# Patient Record
Sex: Female | Born: 1970 | Race: White | Hispanic: No | Marital: Married | Smoking: Never smoker
Health system: Southern US, Community
[De-identification: ages and names within clinical notes are randomized; demographics above are authoritative.]

## PROBLEM LIST (undated history)

## (undated) DIAGNOSIS — T4145XA Adverse effect of unspecified anesthetic, initial encounter: Secondary | ICD-10-CM

## (undated) DIAGNOSIS — Z1589 Genetic susceptibility to other disease: Secondary | ICD-10-CM

## (undated) DIAGNOSIS — Z803 Family history of malignant neoplasm of breast: Secondary | ICD-10-CM

## (undated) DIAGNOSIS — K5900 Constipation, unspecified: Secondary | ICD-10-CM

## (undated) DIAGNOSIS — R5383 Other fatigue: Secondary | ICD-10-CM

## (undated) DIAGNOSIS — T8859XA Other complications of anesthesia, initial encounter: Secondary | ICD-10-CM

## (undated) DIAGNOSIS — Z1502 Genetic susceptibility to malignant neoplasm of ovary: Secondary | ICD-10-CM

## (undated) DIAGNOSIS — Z1509 Genetic susceptibility to other malignant neoplasm: Secondary | ICD-10-CM

## (undated) DIAGNOSIS — C50919 Malignant neoplasm of unspecified site of unspecified female breast: Secondary | ICD-10-CM

## (undated) DIAGNOSIS — G4730 Sleep apnea, unspecified: Secondary | ICD-10-CM

## (undated) HISTORY — DX: Genetic susceptibility to other disease: Z15.89

## (undated) HISTORY — DX: Constipation, unspecified: K59.00

## (undated) HISTORY — DX: Sleep apnea, unspecified: G47.30

## (undated) HISTORY — DX: Other fatigue: R53.83

## (undated) HISTORY — PX: COLONOSCOPY: SHX174

## (undated) HISTORY — DX: Family history of malignant neoplasm of breast: Z80.3

## (undated) HISTORY — DX: Genetic susceptibility to other malignant neoplasm: Z15.09

## (undated) HISTORY — PX: WISDOM TOOTH EXTRACTION: SHX21

## (undated) HISTORY — DX: Malignant neoplasm of unspecified site of unspecified female breast: C50.919

## (undated) HISTORY — DX: Genetic susceptibility to malignant neoplasm of ovary: Z15.02

---

## 1998-01-24 ENCOUNTER — Other Ambulatory Visit: Admission: RE | Admit: 1998-01-24 | Discharge: 1998-01-24 | Payer: Self-pay | Admitting: *Deleted

## 1998-09-04 ENCOUNTER — Other Ambulatory Visit: Admission: RE | Admit: 1998-09-04 | Discharge: 1998-09-04 | Payer: Self-pay | Admitting: *Deleted

## 2005-01-28 ENCOUNTER — Other Ambulatory Visit: Admission: RE | Admit: 2005-01-28 | Discharge: 2005-01-28 | Payer: Self-pay | Admitting: Obstetrics and Gynecology

## 2007-07-16 ENCOUNTER — Ambulatory Visit: Payer: Self-pay | Admitting: Gastroenterology

## 2007-08-05 ENCOUNTER — Ambulatory Visit: Payer: Self-pay | Admitting: Gastroenterology

## 2007-08-05 ENCOUNTER — Encounter: Payer: Self-pay | Admitting: Gastroenterology

## 2007-08-05 DIAGNOSIS — K648 Other hemorrhoids: Secondary | ICD-10-CM | POA: Insufficient documentation

## 2007-08-05 DIAGNOSIS — D126 Benign neoplasm of colon, unspecified: Secondary | ICD-10-CM | POA: Insufficient documentation

## 2008-08-16 ENCOUNTER — Telehealth: Payer: Self-pay | Admitting: Gastroenterology

## 2009-03-04 ENCOUNTER — Emergency Department (HOSPITAL_COMMUNITY): Admission: EM | Admit: 2009-03-04 | Discharge: 2009-03-04 | Payer: Self-pay | Admitting: Family Medicine

## 2009-09-15 ENCOUNTER — Ambulatory Visit: Payer: Self-pay | Admitting: Genetic Counselor

## 2011-03-26 NOTE — Assessment & Plan Note (Signed)
[facility]                         GASTROENTEROLOGY OFFICE NOTE   NAME:[name], [name]                           MRN:          [id number]  DATE:07/16/2007                            DOB:          [birth year]    REFERRING PHYSICIAN:  Juluis Mire, M.D.   REASON FOR REFERRAL:  Chronic constipation and hematochezia.  Family  history of colon cancer.   HISTORY OF PRESENT ILLNESS:  [name] [name] is a 40 year old white female  who returns on referral from Dr. Richardean Chimera.  I saw her previously in  October 1998 for chronic constipation.  She did not follow through with  flexible sigmoidoscopy and further office followup as recommended.  She  states she has had ongoing problems with constipation over the years,  and she has used various laxatives on a regular basis.  She states she  has generally used Correctol, but recently she has been trying other  laxatives including MiraLax on a daily basis which was not effective and  liquid calcium magnesium laxative, which is apparently effective but she  does not like the taste.  She has also tried fiber supplements.  She  notes small amounts of bright red blood per rectum when her constipation  is more severe and she needs to strain.  She has hard stools and  straining with difficulty evacuating stools on a regular basis.  She  occasionally uses enemas.  She notes no change in stool caliber or  weight loss.  She occasionally has mild right lower quadrant pain that  is relieved with bowel movements.  She has had an increase in weight,  worsening fatigue and heavy menstrual periods.  Her maternal grandmother  and maternal grandfather both had colon cancer in their 17s.  Her father  has had multiple precancerous colon polyps.   PAST MEDICAL HISTORY:  Status post vaginal delivery x2.   CURRENT MEDICATIONS:  1. Wellbutrin 150 mg daily.  2. Multivitamin daily.  3. Probiotic daily.  4. Correctol p.r.n.  5. Calcium  magnesium liquid laxative p.r.n.   MEDICATION ALLERGIES:  None known.   SOCIAL HISTORY AND REVIEW OF SYSTEMS:  Per the handwritten form.   PHYSICAL EXAMINATION:  Overweight white female in no acute distress.  Height 5 feet 6 inches, weight 210 pounds, blood pressure is 108/62,  pulse 72 and regular.  HEENT EXAMINATION:  Anicteric sclerae.  Oropharynx clear.  CHEST:  Clear to auscultation bilaterally.  CARDIAC:  Regular rate and rhythm without murmurs appreciated.  ABDOMEN:  Soft and nondistended.  Normoactive bowel sounds.  Mild right  lower quadrant tenderness to deep palpation.  No rebound or guarding.  No palpable organomegaly, masses or hernias.  RECTAL EXAMINATION:  Deferred to time of colonoscopy.  EXTREMITIES:  No clubbing, cyanosis or edema.  NEUROLOGICAL:  Alert and oriented x3.  Grossly nonfocal.   ASSESSMENT AND PLAN:  Chronic constipation which is laxative dependent.  Small volume hematochezia.  Family history of colon cancer.  She is to  maintain a high-fiber diet with adequate fluid intake.  Trial of MiraLax  three times a day  to four times a day and she may titrate the dosing as  needed for adequate regular bowel movements.  Risks, benefits and  alternatives to colonoscopy with possible biopsy and possible  polypectomy discussed with the patient, and she consents to proceed.  This will be scheduled electively.     Venita Lick. Russella Dar, MD, [facility]  Electronically Signed    MTS/MedQ  DD: 07/16/2007  DT: 07/16/2007  Job #: [id number]   cc:   Juluis Mire, M.D.

## 2012-05-04 ENCOUNTER — Other Ambulatory Visit: Payer: Self-pay | Admitting: Obstetrics and Gynecology

## 2012-05-04 DIAGNOSIS — R928 Other abnormal and inconclusive findings on diagnostic imaging of breast: Secondary | ICD-10-CM

## 2012-05-07 ENCOUNTER — Ambulatory Visit
Admission: RE | Admit: 2012-05-07 | Discharge: 2012-05-07 | Disposition: A | Discharge status: Home / Self Care | Payer: BC Managed Care – PPO | Source: Ambulatory Visit | Attending: Obstetrics and Gynecology | Admitting: Obstetrics and Gynecology

## 2012-05-07 DIAGNOSIS — R928 Other abnormal and inconclusive findings on diagnostic imaging of breast: Secondary | ICD-10-CM

## 2012-07-01 ENCOUNTER — Encounter: Payer: Self-pay | Admitting: Gastroenterology

## 2012-08-05 ENCOUNTER — Other Ambulatory Visit: Payer: Self-pay | Admitting: Obstetrics and Gynecology

## 2012-08-05 DIAGNOSIS — N63 Unspecified lump in unspecified breast: Secondary | ICD-10-CM

## 2012-10-27 ENCOUNTER — Encounter: Payer: Self-pay | Admitting: Gastroenterology

## 2012-11-12 ENCOUNTER — Ambulatory Visit
Admission: RE | Admit: 2012-11-12 | Discharge: 2012-11-12 | Disposition: A | Discharge status: Home / Self Care | Payer: BC Managed Care – PPO | Source: Ambulatory Visit | Attending: Obstetrics and Gynecology | Admitting: Obstetrics and Gynecology

## 2012-11-12 DIAGNOSIS — N63 Unspecified lump in unspecified breast: Secondary | ICD-10-CM

## 2012-11-30 ENCOUNTER — Encounter: Payer: Self-pay | Admitting: Gastroenterology

## 2012-11-30 ENCOUNTER — Ambulatory Visit (AMBULATORY_SURGERY_CENTER): Payer: BC Managed Care – PPO | Admitting: *Deleted

## 2012-11-30 VITALS — Ht 66.0 in | Wt 213.0 lb

## 2012-11-30 DIAGNOSIS — Z1211 Encounter for screening for malignant neoplasm of colon: Secondary | ICD-10-CM

## 2012-11-30 DIAGNOSIS — Z800 Family history of malignant neoplasm of digestive organs: Secondary | ICD-10-CM

## 2012-11-30 MED ORDER — PEG-KCL-NACL-NASULF-NA ASC-C 100 G PO SOLR: ORAL | Status: DC

## 2012-11-30 NOTE — Progress Notes (Signed)
No allergy to eggs or soy products 

## 2012-12-03 ENCOUNTER — Telehealth: Payer: Self-pay | Admitting: Gastroenterology

## 2012-12-03 NOTE — Telephone Encounter (Signed)
Pt states she just left her PCP and was diagnosed with a cold and was questioning if okay for procedure Monday with her cough,congestion, and antibiotic use as prescribed today. Pt instructed should be fine for procedure Monday. Take antibiotic as directed, monitor fever any over 100 need to call but with other signs,symptoms should be okay to proceed. Pt verbalized understanding of all instructions given. ewm

## 2012-12-07 ENCOUNTER — Encounter: Payer: Self-pay | Admitting: Gastroenterology

## 2012-12-07 ENCOUNTER — Ambulatory Visit (AMBULATORY_SURGERY_CENTER): Payer: BC Managed Care – PPO | Admitting: Gastroenterology

## 2012-12-07 VITALS — BP 108/73 | HR 83 | Temp 98.8°F | Resp 21 | Ht 66.0 in | Wt 213.0 lb

## 2012-12-07 DIAGNOSIS — Z800 Family history of malignant neoplasm of digestive organs: Secondary | ICD-10-CM

## 2012-12-07 DIAGNOSIS — D126 Benign neoplasm of colon, unspecified: Secondary | ICD-10-CM

## 2012-12-07 DIAGNOSIS — Z1211 Encounter for screening for malignant neoplasm of colon: Secondary | ICD-10-CM

## 2012-12-07 MED ORDER — SODIUM CHLORIDE 0.9 % IV SOLN: 500.0000 mL | INTRAVENOUS | Status: DC

## 2012-12-07 NOTE — Op Note (Signed)
[facility] [address] [place], [address]   COLONOSCOPY PROCEDURE REPORT  PATIENT: [name], [name]  MR#: [id number] BIRTHDATE: November 11, 1971 , 41  yrs. old GENDER: Female ENDOSCOPIST: Meryl Dare, MD, [facility] PROCEDURE DATE:  12/07/2012 PROCEDURE:   Colonoscopy with snare polypectomy ASA CLASS:   Class II INDICATIONS:elevated risk screening and patient's family history of colon cancer, distant relatives. MEDICATIONS: MAC sedation, administered by CRNA and propofol (Diprivan) 400mg  IV DESCRIPTION OF PROCEDURE:   After the risks benefits and alternatives of the procedure were thoroughly explained, informed consent was obtained.  A digital rectal exam revealed no abnormalities of the rectum.   The LB [id number]  endoscope was introduced through the anus and advanced to the cecum, which was identified by both the appendix and ileocecal valve. No adverse events experienced with a tortuous and redundant colon.   The quality of the prep was good, using MoviPrep  The instrument was then slowly withdrawn as the colon was fully examined.  COLON FINDINGS: A sessile polyp measuring 7 mm in size was found at the hepatic flexure.  A polypectomy was performed with a cold snare.  The resection was complete and the polyp tissue was completely retrieved.   A sessile polyp measuring 5 mm in size was found in the sigmoid colon.  A polypectomy was performed with a cold snare.  The resection was complete and the polyp tissue was completely retrieved.   The colon was otherwise normal.  There was no diverticulosis, inflammation, polyps or cancers unless previously stated.  Retroflexed views revealed small internal hemorrhoids. The time to cecum=9 minutes 53 seconds.  Withdrawal time=11 minutes 20 seconds.  The scope was withdrawn and the procedure completed.  COMPLICATIONS: There were no complications.  ENDOSCOPIC IMPRESSION: 1.   Sessile polyp measuring 7 mm at the hepatic  flexure; polypectomy performed with a cold snare 2.   Sessile polyp measuring 5 mm in the sigmoid colon; polypectomy performed with a cold snare 3.   Small internal hemorrhoids  RECOMMENDATIONS: 1.  Await pathology results 2.  Repeat Colonoscopy in 5 years.   eSigned:  Meryl Dare, MD, [facility] 12/07/2012 8:57 AM   cc: Richardean Chimera, MD

## 2012-12-07 NOTE — Progress Notes (Signed)
Called to room to assist during endoscopic procedure.  Patient ID and intended procedure confirmed with present staff. Received instructions for my participation in the procedure from the performing physician.  

## 2012-12-07 NOTE — Progress Notes (Signed)
Patient did not experience any of the following events: a burn prior to discharge; a fall within the facility; wrong site/side/patient/procedure/implant event; or a hospital transfer or hospital admission upon discharge from the facility. (G8907) Patient did not have preoperative order for IV antibiotic SSI prophylaxis. (G8918)  

## 2012-12-07 NOTE — Patient Instructions (Addendum)

## 2012-12-07 NOTE — Progress Notes (Signed)
VSS< A&OX3 Pleased with MAC, Report to April RN

## 2012-12-08 ENCOUNTER — Telehealth: Payer: Self-pay | Admitting: *Deleted

## 2012-12-08 NOTE — Telephone Encounter (Signed)
  Follow up Call-  Call back number 12/07/2012  Post procedure Call Back phone  # [phone]  Permission to leave phone message Yes     Patient questions:  Do you have a fever, pain , or abdominal swelling? no Pain Score  0 *  Have you tolerated food without any problems? yes  Have you been able to return to your normal activities? yes  Do you have any questions about your discharge instructions: Diet   no Medications  no Follow up visit  no  Do you have questions or concerns about your Care? no  Actions: * If pain score is 4 or above: No action needed, pain <4.

## 2012-12-10 ENCOUNTER — Encounter: Payer: Self-pay | Admitting: Gastroenterology

## 2013-04-07 ENCOUNTER — Other Ambulatory Visit: Payer: Self-pay | Admitting: Obstetrics and Gynecology

## 2013-04-07 DIAGNOSIS — N6489 Other specified disorders of breast: Secondary | ICD-10-CM

## 2013-05-12 ENCOUNTER — Ambulatory Visit
Admission: RE | Admit: 2013-05-12 | Discharge: 2013-05-12 | Disposition: A | Discharge status: Home / Self Care | Payer: BC Managed Care – PPO | Source: Ambulatory Visit | Attending: Obstetrics and Gynecology | Admitting: Obstetrics and Gynecology

## 2013-05-12 DIAGNOSIS — N6489 Other specified disorders of breast: Secondary | ICD-10-CM

## 2013-11-11 HISTORY — PX: OTHER SURGICAL HISTORY: SHX169

## 2013-12-03 ENCOUNTER — Telehealth: Payer: Self-pay | Admitting: Gastroenterology

## 2013-12-03 NOTE — Telephone Encounter (Signed)
OK. She can use Prep H supp daily for 5 days for presumed hemorrhoids.

## 2013-12-03 NOTE — Telephone Encounter (Signed)
Patient with a history of small internal hemorrhoids on colonoscopy 11/2012.  She has had 4 days of passing bright red blood with stools.  She is not passing blood independent of stool.  She does feels "a little gassy", but otherwise no complaints.  She will come in and be seen on 12/07/13 with Alonza Bogus, PA.  She will go to the ER until the appt if she develops any additional symptoms or has blood independent of stool.

## 2013-12-03 NOTE — Telephone Encounter (Signed)
Patient aware.

## 2013-12-07 ENCOUNTER — Ambulatory Visit: Payer: BC Managed Care – PPO | Admitting: Gastroenterology

## 2014-06-20 ENCOUNTER — Other Ambulatory Visit: Payer: Self-pay | Admitting: Obstetrics and Gynecology

## 2014-06-21 LAB — CYTOLOGY - PAP

## 2014-12-21 ENCOUNTER — Other Ambulatory Visit: Payer: Self-pay | Admitting: Obstetrics and Gynecology

## 2014-12-22 LAB — CYTOLOGY - PAP

## 2015-08-02 ENCOUNTER — Other Ambulatory Visit: Payer: Self-pay | Admitting: Obstetrics and Gynecology

## 2015-08-03 LAB — CYTOLOGY - PAP

## 2015-11-12 HISTORY — PX: REPLACEMENT TOTAL KNEE: SUR1224

## 2017-09-02 ENCOUNTER — Telehealth: Payer: Self-pay | Admitting: Gastroenterology

## 2017-09-02 NOTE — Telephone Encounter (Signed)
Patient c/o constipation and hemorrhoids.  She is asking about a referral for surgery.  Patient has not been seen since 2014.  She is advised she will need to be seen prior to any referrals.  She will come in on 10/21/17 9:15 with Dr. Fuller Plan and will try Miralax 1-2 times a day until appt.

## 2017-10-21 ENCOUNTER — Ambulatory Visit: Payer: BC Managed Care – PPO | Admitting: Gastroenterology

## 2017-11-12 ENCOUNTER — Other Ambulatory Visit: Payer: Self-pay | Admitting: Obstetrics and Gynecology

## 2017-11-12 DIAGNOSIS — Z803 Family history of malignant neoplasm of breast: Secondary | ICD-10-CM

## 2017-11-21 ENCOUNTER — Telehealth: Payer: Self-pay

## 2017-11-21 NOTE — Telephone Encounter (Signed)
Patient notified Procedure and pre-visit scheduled

## 2017-11-21 NOTE — Telephone Encounter (Signed)
Left message for patient to call back  

## 2017-11-21 NOTE — Telephone Encounter (Signed)
-----  Message from Ladene Artist, MD sent at 11/21/2017 12:31 PM EST ----- She is due for colonoscopy 11/2017 with history of colon polyps. Received records from Dr. 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 Recommend that she schedules a direct colonoscopy as soon as she can.

## 2017-11-29 ENCOUNTER — Ambulatory Visit
Admission: RE | Admit: 2017-11-29 | Discharge: 2017-11-29 | Disposition: A | Discharge status: Home / Self Care | Payer: BC Managed Care – PPO | Source: Ambulatory Visit | Attending: Obstetrics and Gynecology | Admitting: Obstetrics and Gynecology

## 2017-11-29 DIAGNOSIS — Z803 Family history of malignant neoplasm of breast: Secondary | ICD-10-CM

## 2017-11-29 MED ORDER — GADOBENATE DIMEGLUMINE 529 MG/ML IV SOLN
19.0000 mL | Freq: Once | INTRAVENOUS | Status: AC | PRN
  Administered 2017-11-29: 19 mL via INTRAVENOUS

## 2017-12-04 ENCOUNTER — Telehealth: Payer: Self-pay | Admitting: Hematology and Oncology

## 2017-12-04 ENCOUNTER — Encounter: Payer: Self-pay | Admitting: Hematology and Oncology

## 2017-12-04 NOTE — Telephone Encounter (Signed)
Appt has been scheduled for the pt to see Dr. Lindi Adie on 2/13 at 915am. 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 Letter mailed with the appt information

## 2017-12-08 ENCOUNTER — Ambulatory Visit: Payer: BC Managed Care – PPO | Admitting: Gastroenterology

## 2017-12-08 DIAGNOSIS — Z1501 Genetic susceptibility to malignant neoplasm of breast: Secondary | ICD-10-CM | POA: Insufficient documentation

## 2017-12-09 ENCOUNTER — Ambulatory Visit (AMBULATORY_SURGERY_CENTER): Payer: Self-pay

## 2017-12-09 VITALS — Ht 66.0 in | Wt 200.2 lb

## 2017-12-09 DIAGNOSIS — Z800 Family history of malignant neoplasm of digestive organs: Secondary | ICD-10-CM

## 2017-12-09 DIAGNOSIS — Z86010 Personal history of colonic polyps: Secondary | ICD-10-CM

## 2017-12-09 MED ORDER — NA SULFATE-K SULFATE-MG SULF 17.5-3.13-1.6 GM/177ML PO SOLN: 1.0000 | Freq: Once | ORAL | 0 refills | Status: AC

## 2017-12-09 NOTE — Progress Notes (Signed)
Per pt, no allergies to soy or egg products.Pt not taking any weight loss meds or using  O2 at home.  Pt refused emmi video. 

## 2017-12-09 NOTE — Addendum Note (Signed)
Addended by: Selina Cooley on: 12/09/2017 03:44 PM   Modules accepted: Orders

## 2017-12-10 ENCOUNTER — Encounter: Payer: Self-pay | Admitting: Gastroenterology

## 2017-12-16 ENCOUNTER — Ambulatory Visit: Payer: Self-pay | Admitting: General Surgery

## 2017-12-24 ENCOUNTER — Inpatient Hospital Stay: Payer: BC Managed Care – PPO | Attending: Hematology and Oncology | Admitting: Hematology and Oncology

## 2017-12-24 DIAGNOSIS — Z1501 Genetic susceptibility to malignant neoplasm of breast: Secondary | ICD-10-CM | POA: Diagnosis present

## 2017-12-24 DIAGNOSIS — Z1502 Genetic susceptibility to malignant neoplasm of ovary: Secondary | ICD-10-CM

## 2017-12-24 DIAGNOSIS — Z1589 Genetic susceptibility to other disease: Secondary | ICD-10-CM

## 2017-12-24 DIAGNOSIS — Z800 Family history of malignant neoplasm of digestive organs: Secondary | ICD-10-CM | POA: Diagnosis not present

## 2017-12-24 DIAGNOSIS — Z1509 Genetic susceptibility to other malignant neoplasm: Secondary | ICD-10-CM

## 2017-12-24 DIAGNOSIS — Z803 Family history of malignant neoplasm of breast: Secondary | ICD-10-CM | POA: Diagnosis not present

## 2017-12-24 NOTE — Progress Notes (Signed)
[facility] NOTE  Patient Care Team: Arvella Nigh, MD as PCP - General (Obstetrics and Gynecology)  CHIEF COMPLAINTS/PURPOSE OF CONSULTATION:  CHEK-2 mutation  HISTORY OF PRESENTING ILLNESS:  [name] [name] 47 y.o. female is here because of recent diagnosis of CHEK-2 mutation.  Patient had an extensive family history of cancers including her mother who had breast cancer who died at the age of 86.  She has 5 aunts who had breast cancers as well.  Grandmother had colon cancer.  She was referred for genetic testing by her physicians.  The genetic test result came back positive for CHEK-2 [phone] del.  She was referred to see Dr. Marlou Starks who discussed different options with her and determined that bilateral mastectomies is her preference. She also gets colonoscopies every 3-5 years.  Tomorrow she has a colonoscopy scheduled.  We were asked to consult to discuss the risks and benefits of different types of screening measures.   MEDICAL HISTORY:  Past Medical History:  Diagnosis Date  . 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 Constipation    uses suppositories prn  . Fatigue    Juice plus and testosterone helps    SURGICAL HISTORY: Past Surgical History:  Procedure Laterality Date  . COLONOSCOPY    . 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 torn meniscus  2015   left leg/ right knee in 2016  . WISDOM TOOTH EXTRACTION     in mid 20's    SOCIAL HISTORY: Social History   Socioeconomic History  . Marital status: Married    Spouse name: Not on file  . Number of children: Not on file  . Years of education: Not on file  . Highest education level: Not on file  Social Needs  . Financial resource strain: Not on file  . Food insecurity - worry: Not on file  . Food insecurity - inability: Not on file  . Transportation needs - medical: Not on file  . Transportation needs - non-medical: Not on file   Occupational History  . Not on file  Tobacco Use  . Smoking status: Never Smoker  . Smokeless tobacco: Never Used  Substance and Sexual Activity  . Alcohol use: No  . Drug use: No  . Sexual activity: Yes    Comment: husband had vasectomy  Other Topics Concern  . Not on file  Social History Narrative  . Not on file    FAMILY HISTORY: Family History  Problem Relation Age of Onset  . Colon cancer Maternal Grandmother   . Breast cancer Mother   . Heart disease Father   . Stroke Father   . Esophageal cancer Neg Hx   . Stomach cancer Neg Hx   . Rectal cancer Neg Hx     ALLERGIES:  is allergic to oxycodone.  MEDICATIONS:  Current Outpatient Medications  Medication Sig Dispense Refill  . Acetaminophen (TYLENOL PO) Take 500 mg by mouth as needed.    Marland Kitchen amoxicillin-clavulanate (AUGMENTIN) 875-125 MG per tablet     . aspirin EC 81 MG tablet Take 81 mg by mouth daily.    . Cholecalciferol (VITAMIN D PO) Take 3,000 Units by mouth daily.     . COD LIVER OIL PO Take by mouth daily.    . NON FORMULARY Testosterone cream 2 %- Use daily at night    . NON FORMULARY Juice Plus capsules-Take one daily  No current facility-administered medications for this visit.     REVIEW OF SYSTEMS:   Constitutional: Denies fevers, chills or abnormal night sweats Eyes: Denies blurriness of vision, double vision or watery eyes Ears, nose, mouth, throat, and face: Denies mucositis or sore throat Respiratory: Denies cough, dyspnea or wheezes Cardiovascular: Denies palpitation, chest discomfort or lower extremity swelling Gastrointestinal:  Denies nausea, heartburn or change in bowel habits Skin: Denies abnormal skin rashes Lymphatics: Denies new lymphadenopathy or easy bruising Neurological:Denies numbness, tingling or new weaknesses Behavioral/Psych: Mood is stable, no new changes  Breast:  Denies any palpable lumps or discharge All other systems were reviewed with the patient and are  negative.  PHYSICAL EXAMINATION: ECOG PERFORMANCE STATUS: 0 - Asymptomatic  Vitals:   12/24/17 0915  BP: 115/63  Pulse: 93  Resp: 20  Temp: 98.2 F (36.8 C)  SpO2: 100%   Filed Weights   12/24/17 0915  Weight: 198 lb 6.4 oz (90 kg)    GENERAL:alert, no distress and comfortable SKIN: skin color, texture, turgor are normal, no rashes or significant lesions EYES: normal, conjunctiva are pink and non-injected, sclera clear OROPHARYNX:no exudate, no erythema and lips, buccal mucosa, and tongue normal  NECK: supple, thyroid normal size, non-tender, without nodularity LYMPH:  no palpable lymphadenopathy in the cervical, axillary or inguinal LUNGS: clear to auscultation and percussion with normal breathing effort HEART: regular rate & rhythm and no murmurs and no lower extremity edema ABDOMEN:abdomen soft, non-tender and normal bowel sounds Musculoskeletal:no cyanosis of digits and no clubbing  PSYCH: alert & oriented x 3 with fluent speech NEURO: no focal motor/sensory deficits BREAST: No palpable nodules in breast. No palpable axillary or supraclavicular lymphadenopathy (exam performed in the presence of a chaperone)   LABORATORY DATA:  I have reviewed the data as listed No results found for: WBC, HGB, HCT, MCV, PLT No results found for: NA, K, CL, CO2  RADIOGRAPHIC STUDIES: I have personally reviewed the radiological reports and agreed with the findings in the report.  ASSESSMENT AND PLAN:  Monoallelic mutation of CHEK2 gene in female patient CHEK 2 :Based on NCCN guidelines, this is a pathogenic mutation that has been associated with risk of not only breast cancer but also colon, thyroid and kidney cancers.   Pathogenesis: I discussed with the patient that CHEK-2 encodes for a serine-threonine tyrosine kinase involved in the DNA repair in combination with ATM, BRCA1, P53 genes. Patients with mutation of the CHEK-2 gene results in the damaged DNAgetting replicated and increase  the patient's risk for cancers.   Breast cancer surveillance: I discussed annual mammograms and annual breast MRIs combined with monthly self breast examinations and breast examinations with her physician VS bilateral mastectomies.   Given her familial history, if she chooses to undergo bilateral mastectomy it would not be unreasonable. Lifetime risk of breast cancer varies from 28% to 38%. Higher with family history of breast cancer.   Other cancer surveillance: Apart of breast cancer, there are no definite surveillance approaches to kidney cancer. For colon cancer she is currently getting colonoscopies every 3-5 years.  Since there is no family history thyroid cancer she will need manual thyroid examinations for any lumps or nodules.  Return to clinic on an as-needed basis   All questions were answered. The patient knows to call the clinic with any problems, questions or concerns.    Harriette Ohara, MD 12/24/17

## 2017-12-24 NOTE — Assessment & Plan Note (Signed)
CHEK 2 :Based on NCCN guidelines, this is a pathogenic mutation that has been associated with risk of not only breast cancer but also colon, thyroid and kidney cancers.   Pathogenesis: I discussed with the patient that CHEK-2 encodes for a serine-threonine tyrosine kinase involved in the DNA repair in combination with ATM, BRCA1, P53 genes. Patients with mutation of the CHEK-2 gene results in the damaged DNAgetting replicated and increase the patient's risk for cancers.   Surveillance: I recommended annual mammograms and annual breast MRIs combined with monthly self breast examinations and breast examinations with her physician. Patient would like to continue the surveillance plan under the care of her gynecologist. I also discussed different other options including prophylactic bilateral mastectomies. Based on NCCN guidelines, there is no data to support the role of prophylactic bilateral mastectomy for CHEK-2 mutation. However given her familial history, if she chooses to undergo bilateral mastectomy it would not be unreasonable. Lifetime risk of breast cancer varies from 28% to 38%. Higher with family history of breast cancer.   Other cancer surveillance: Apart of breast cancer, there are no definite surveillance approaches to kidney cancer. For colon cancer she will need a colonoscopy at age 72 since there is no family history and for thyroid cancer she will need manual thyroid examinations for any lumps or nodules.  Return to clinic on an annual basis

## 2017-12-25 ENCOUNTER — Ambulatory Visit (AMBULATORY_SURGERY_CENTER): Payer: BC Managed Care – PPO | Admitting: Gastroenterology

## 2017-12-25 ENCOUNTER — Encounter: Payer: BC Managed Care – PPO | Admitting: Gastroenterology

## 2017-12-25 ENCOUNTER — Encounter: Payer: Self-pay | Admitting: Gastroenterology

## 2017-12-25 ENCOUNTER — Other Ambulatory Visit: Payer: Self-pay

## 2017-12-25 VITALS — BP 109/71 | HR 75 | Temp 98.0°F | Resp 13 | Ht 66.0 in | Wt 200.0 lb

## 2017-12-25 DIAGNOSIS — Z1211 Encounter for screening for malignant neoplasm of colon: Secondary | ICD-10-CM | POA: Diagnosis not present

## 2017-12-25 DIAGNOSIS — D123 Benign neoplasm of transverse colon: Secondary | ICD-10-CM

## 2017-12-25 DIAGNOSIS — Z1212 Encounter for screening for malignant neoplasm of rectum: Secondary | ICD-10-CM

## 2017-12-25 DIAGNOSIS — Z800 Family history of malignant neoplasm of digestive organs: Secondary | ICD-10-CM

## 2017-12-25 DIAGNOSIS — D125 Benign neoplasm of sigmoid colon: Secondary | ICD-10-CM | POA: Diagnosis not present

## 2017-12-25 MED ORDER — SODIUM CHLORIDE 0.9 % IV SOLN: 500.0000 mL | Freq: Once | INTRAVENOUS | Status: DC

## 2017-12-25 NOTE — Progress Notes (Signed)
Called to room to assist during endoscopic procedure.  Patient ID and intended procedure confirmed with present staff. Received instructions for my participation in the procedure from the performing physician.  

## 2017-12-25 NOTE — Progress Notes (Signed)
Pt's states no medical or surgical changes since previsit or office visit. 

## 2017-12-25 NOTE — Op Note (Signed)
[place] Patient Name: [name] [name] Procedure Date: 12/25/2017 8:34 AM MRN: [id number] Endoscopist: Ladene Artist , MD Age: 47 Referring MD:  Date of Birth: [birth year] Gender: Female Account #: [id number] Procedure:                Colonoscopy Indications:              Colon cancer screening in patient at increased                            risk: Family history of colorectal cancer in                            multiple 2nd degree relatives. CHEK2 gene positive. Medicines:                Monitored Anesthesia Care Procedure:                Pre-Anesthesia Assessment:                           - Prior to the procedure, a History and Physical                            was performed, and patient medications and                            allergies were reviewed. The patient's tolerance of                            previous anesthesia was also reviewed. The risks                            and benefits of the procedure and the sedation                            options and risks were discussed with the patient.                            All questions were answered, and informed consent                            was obtained. Prior Anticoagulants: The patient has                            taken no previous anticoagulant or antiplatelet                            agents. ASA Grade Assessment: II - A patient with                            mild systemic disease. After reviewing the risks                            and benefits, the patient was deemed in  satisfactory condition to undergo the procedure.                           After obtaining informed consent, the colonoscope                            was passed under direct vision. Throughout the                            procedure, the patient's blood pressure, pulse, and                            oxygen saturations were monitored continuously. The                            Model [id number]-[id number]  [id number]) scope was introduced                            through the anus and advanced to the the cecum,                            identified by appendiceal orifice and ileocecal                            valve. The ileocecal valve, appendiceal orifice,                            and rectum were photographed. The quality of the                            bowel preparation was excellent. The colonoscopy                            was performed without difficulty. The colon was                            tortuous. The patient tolerated the procedure well. Scope In: 8:47:12 AM Scope Out: 9:10:09 AM Scope Withdrawal Time: 0 hours 14 minutes 52 seconds  Total Procedure Duration: 0 hours 22 minutes 57 seconds  Findings:                 The perianal and digital rectal examinations were                            normal.                           Two sessile polyps were found in the sigmoid colon                            and hepatic flexure. The polyps were 6 to 8 mm in                            size. These polyps were removed with a cold snare.  Resection and retrieval were complete.                           Internal hemorrhoids were found during                            retroflexion. The hemorrhoids were medium-sized and                            Grade II (internal hemorrhoids that prolapse but                            reduce spontaneously).                           The exam was otherwise without abnormality on                            direct and retroflexion views. Complications:            No immediate complications. Estimated blood loss:                            None. Estimated Blood Loss:     Estimated blood loss: none. Impression:               - Two 6 to 8 mm polyps in the sigmoid colon and at                            the hepatic flexure, removed with a cold snare.                            Resected and retrieved.                            - Internal hemorrhoids.                           - The examination was otherwise normal on direct                            and retroflexion views. Recommendation:           - Repeat colonoscopy in 5 years for surveillance.                           - Patient has a contact number available for                            emergencies. The signs and symptoms of potential                            delayed complications were discussed with the                            patient. Return to normal activities tomorrow.  Written discharge instructions were provided to the                            patient.                           - Resume previous diet.                           - Continue present medications.                           - Await pathology results. Ladene Artist, MD 12/25/2017 9:13:01 AM This report has been signed electronically.

## 2017-12-25 NOTE — Progress Notes (Signed)
Report to PACU, RN, vss, BBS= Clear.  

## 2017-12-25 NOTE — Patient Instructions (Signed)
YOU HAD AN ENDOSCOPIC PROCEDURE TODAY AT [facility]:   Refer to the procedure report that was given to you for any specific questions about what was found during the examination.  If the procedure report does not answer your questions, please call your gastroenterologist to clarify.  If you requested that your care partner not be given the details of your procedure findings, then the procedure report has been included in a sealed envelope for you to review at your convenience later.  YOU SHOULD EXPECT: Some feelings of bloating in the abdomen. Passage of more gas than usual.  Walking can help get rid of the air that was put into your GI tract during the procedure and reduce the bloating. If you had a lower endoscopy (such as a colonoscopy or flexible sigmoidoscopy) you may notice spotting of blood in your stool or on the toilet paper. If you underwent a bowel prep for your procedure, you may not have a normal bowel movement for a few days.  Please Note:  You might notice some irritation and congestion in your nose or some drainage.  This is from the oxygen used during your procedure.  There is no need for concern and it should clear up in a day or so.  SYMPTOMS TO REPORT IMMEDIATELY:   Following lower endoscopy (colonoscopy or flexible sigmoidoscopy):  Excessive amounts of blood in the stool  Significant tenderness or worsening of abdominal pains  Swelling of the abdomen that is new, acute  Fever of 100F or higher  For urgent or emergent issues, a gastroenterologist can be reached at any hour by calling [phone].   DIET:  We do recommend a small meal at first, but then you may proceed to your regular diet.  Drink plenty of fluids but you should avoid alcoholic beverages for 24 hours.  MEDICATIONS: Continue present medications.  Please see handouts given to you by your recovery nurse.  FOLLOW-UP: Call the GI office to schedule hemorrhoid banding procedure if you wish  to have that performed.  ACTIVITY:  You should plan to take it easy for the rest of today and you should NOT DRIVE or use heavy machinery until tomorrow (because of the sedation medicines used during the test).    FOLLOW UP: Our staff will call the number listed on your records the next business day following your procedure to check on you and address any questions or concerns that you may have regarding the information given to you following your procedure. If we do not reach you, we will leave a message.  However, if you are feeling well and you are not experiencing any problems, there is no need to return our call.  We will assume that you have returned to your regular daily activities without incident.  If any biopsies were taken you will be contacted by phone or by letter within the next 1-3 weeks.  Please call us at [phone] if you have not heard about the biopsies in 3 weeks.    Thank you for allowing [place] to provide for your healthcare needs today.  SIGNATURES/CONFIDENTIALITY: You and/or your care partner have signed paperwork which will be entered into your electronic medical record.  These signatures attest to the fact that that the information above on your After Visit Summary has been reviewed and is understood.  Full responsibility of the confidentiality of this discharge information lies with you and/or your care-partner.

## 2017-12-26 ENCOUNTER — Telehealth: Payer: Self-pay | Admitting: *Deleted

## 2017-12-26 NOTE — Telephone Encounter (Signed)
  Follow up Call-  Call back number 12/25/2017  Post procedure Call Back phone  # [phone]  Permission to leave phone message Yes  Some recent data might be hidden     Patient questions:  Do you have a fever, pain , or abdominal swelling? No. Pain Score  0 *  Have you tolerated food without any problems? Yes.    Have you been able to return to your normal activities? No.  Do you have any questions about your discharge instructions: Diet   No. Medications  No. Follow up visit  No.  Do you have questions or concerns about your Care? No.  Actions: * If pain score is 4 or above: No action needed, pain <4.

## 2017-12-26 NOTE — Telephone Encounter (Signed)
No answer, left message to call if questions or concerns. 

## 2018-01-10 ENCOUNTER — Encounter: Payer: Self-pay | Admitting: Gastroenterology

## 2018-01-21 ENCOUNTER — Ambulatory Visit (HOSPITAL_COMMUNITY): Payer: Self-pay | Admitting: Plastic Surgery

## 2018-01-27 NOTE — Pre-Procedure Instructions (Signed)
[name] [name]  01/27/2018      [facility] [place], [place] [address] Phone: [phone] Fax: [phone]    Your procedure is scheduled on Thurs., February 05, 2018  Report to [facility] Admitting Entrance "A" at 5:30AM   Call this number if you have problems the morning of surgery:  [phone]   Remember:  Do not eat food or drink liquids after midnight.  Take these medicines the morning of surgery with A SIP OF WATER: If needed Acetaminophen (TYLENOL) for pain.  Follow your doctor's instruction regarding Aspirin.  7 days before surgery (Mar. 21), stop taking all Aspirins, Vitamins, Fish oils, and Herbal medications. Also stop all NSAIDS i.e. Advil, Ibuprofen, Motrin, Aleve, Anaprox, Naproxen, BC and Goody Powders.  Please complete your PRE-SURGERY ENSURE that was given to before you leave your house the morning of surgery.  Please, if able, drink it in one setting. DO NOT SIP.   Do not wear jewelry, make-up or nail polish.  Do not wear lotions, powders, perfumes, or deodorant.  Do not shave 48 hours prior to surgery.    Do not bring valuables to the hospital.  [facility] is not responsible for any belongings or valuables.  Contacts, dentures or bridgework may not be worn into surgery.  Leave your suitcase in the car.  After surgery it may be brought to your room.  For patients admitted to the hospital, discharge time will be determined by your treatment team.  Patients discharged the day of surgery will not be allowed to drive home.   Special instructions:  [place]- Preparing For Surgery  Before surgery, you can play an important role. Because skin is not sterile, your skin needs to be as free of germs as possible. You can reduce the number of germs on your skin by washing with CHG (chlorahexidine gluconate) Soap before surgery.  CHG is an antiseptic cleaner which kills germs and bonds with  the skin to continue killing germs even after washing.  Please do not use if you have an allergy to CHG or antibacterial soaps. If your skin becomes reddened/irritated stop using the CHG.  Do not shave (including legs and underarms) for at least 48 hours prior to first CHG shower. It is OK to shave your face.  Please follow these instructions carefully.   1. Shower the NIGHT BEFORE SURGERY and the MORNING OF SURGERY with CHG.   2. If you chose to wash your hair, wash your hair first as usual with your normal shampoo.  3. After you shampoo, rinse your hair and body thoroughly to remove the shampoo.  4. Use CHG as you would any other liquid soap. You can apply CHG directly to the skin and wash gently with a scrungie or a clean washcloth.   5. Apply the CHG Soap to your body ONLY FROM THE NECK DOWN.  Do not use on open wounds or open sores. Avoid contact with your eyes, ears, mouth and genitals (private parts). Wash Face and genitals (private parts)  with your normal soap.  6. Wash thoroughly, paying special attention to the area where your surgery will be performed.  7. Thoroughly rinse your body with warm water from the neck down.  8. DO NOT shower/wash with your normal soap after using and rinsing off the CHG Soap.  9. Pat yourself dry with a CLEAN TOWEL.  10. Wear CLEAN PAJAMAS to  bed the night before surgery, wear comfortable clothes the morning of surgery  11. Place CLEAN SHEETS on your bed the night of your first shower and DO NOT SLEEP WITH PETS.  Day of Surgery: Do not apply any deodorants/lotions. Please wear clean clothes to the hospital/surgery center.    Please read over the following fact sheets that you were given. Pain Booklet, Coughing and Deep Breathing and Surgical Site Infection Prevention

## 2018-01-28 ENCOUNTER — Encounter (HOSPITAL_COMMUNITY): Payer: Self-pay

## 2018-01-28 ENCOUNTER — Other Ambulatory Visit: Payer: Self-pay

## 2018-01-28 ENCOUNTER — Encounter (HOSPITAL_COMMUNITY)
Admission: RE | Admit: 2018-01-28 | Discharge: 2018-01-28 | Disposition: A | Discharge status: Home / Self Care | Payer: BC Managed Care – PPO | Source: Ambulatory Visit | Attending: General Surgery | Admitting: General Surgery

## 2018-01-28 DIAGNOSIS — E669 Obesity, unspecified: Secondary | ICD-10-CM | POA: Diagnosis not present

## 2018-01-28 DIAGNOSIS — Z7982 Long term (current) use of aspirin: Secondary | ICD-10-CM | POA: Diagnosis not present

## 2018-01-28 DIAGNOSIS — Z01818 Encounter for other preprocedural examination: Secondary | ICD-10-CM | POA: Insufficient documentation

## 2018-01-28 DIAGNOSIS — I4510 Unspecified right bundle-branch block: Secondary | ICD-10-CM | POA: Insufficient documentation

## 2018-01-28 DIAGNOSIS — Z96651 Presence of right artificial knee joint: Secondary | ICD-10-CM | POA: Diagnosis not present

## 2018-01-28 DIAGNOSIS — Z6832 Body mass index (BMI) 32.0-32.9, adult: Secondary | ICD-10-CM | POA: Insufficient documentation

## 2018-01-28 HISTORY — DX: Other complications of anesthesia, initial encounter: T88.59XA

## 2018-01-28 HISTORY — DX: Adverse effect of unspecified anesthetic, initial encounter: T41.45XA

## 2018-01-28 LAB — CBC
HEMATOCRIT: 39.3 % (ref 36.0–46.0)
HEMOGLOBIN: 12.8 g/dL (ref 12.0–15.0)
MCH: 28.1 pg (ref 26.0–34.0)
MCHC: 32.6 g/dL (ref 30.0–36.0)
MCV: 86.2 fL (ref 78.0–100.0)
Platelets: 414 10*3/uL — ABNORMAL HIGH (ref 150–400)
RBC: 4.56 MIL/uL (ref 3.87–5.11)
RDW: 14.4 % (ref 11.5–15.5)
WBC: 7.5 10*3/uL (ref 4.0–10.5)

## 2018-01-28 LAB — BASIC METABOLIC PANEL
Anion gap: 7 (ref 5–15)
BUN: 15 mg/dL (ref 6–20)
CHLORIDE: 105 mmol/L (ref 101–111)
CO2: 24 mmol/L (ref 22–32)
Calcium: 9.3 mg/dL (ref 8.9–10.3)
Creatinine, Ser: 0.87 mg/dL (ref 0.44–1.00)
GFR calc Af Amer: >60 mL/min (ref >60)
GFR calc non Af Amer: >60 mL/min (ref >60)
Glucose, Bld: 98 mg/dL (ref 65–99)
POTASSIUM: 3.7 mmol/L (ref 3.5–5.1)
Sodium: 136 mmol/L (ref 135–145)

## 2018-01-28 NOTE — Progress Notes (Signed)
Message left at Dr. Ethlyn Gallery office regarding consent clarification.

## 2018-01-28 NOTE — Progress Notes (Addendum)
Anesthesia PAT Evaluation: Patient is a 47 year old female scheduled for bilateral mastectomies (Dr. Autumn Messing) and bilateral breast reconstruction with placement of tissue expander and Flex HD (Dr. Crissie Reese) on 02/05/18. (Currently case is posted as nipple sparing, but patient says that it is supposed to be "skin sparing". Stephanie at Dr. Ethlyn Gallery office was notified, and she will review with Dr. 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 Her mother and 5 aunts have all had breast cancer and four have died from breast cancer.  History includes never smoker, fatigue, right TKA '17 (reports she "woke up" during this procedure, but may have been under spinal anesthesia). BMI is consistent with obesity.   - PCP is Dr. Tamsen Roers with [facility], although patient only sees as needed for URI, rash, etc..  - GYN Is Dr. Arvella Nigh.  Meds include ASA 81 mg (last dose 01/22/18), vitamin D3, testosterone cream 2% (taking for fatigue), Juice Plus Fibre.   BP 122/61   Pulse 69   Temp 37.2 C   Resp 18   Ht 5\' 6"  (1.676 m)   Wt 198 lb 6.4 oz (90 kg)   LMP 12/16/2017   SpO2 100%   BMI 32.02 kg/m  Exam: Somewhat small mouth opening, but appears to be at least 3FB. Mallampati II. No carotid bruits noted. Heart RRR, no murmurs. Chest was non-tender. Lungs clear. No LE pitting edema.   I saw patient due to reports of vague non-exertional chest symptoms. She thinks it may be anxiety due to her nearing surgery date. She noted intermittently about 3 months ago, but not associated with activity or with other symptoms like SOB, nausea, dizziness, diaphoresis. She has never had to rest because of her symptoms and did not personally feel that it was her heart, but wanted to be forthcoming with staff as her surgery approaches. Symptoms could last for up to a couple of minutes, go away on their own, and occurred primarily when she felt emotionally stressed. She had more left shoulder/upper  chest soreness the day after going to the gym and lifting a kettle bell 100 times. She also reported that sometimes the pain could be located in the epigastric region or under her right breast, and sometimes feels like her left jugular vein is bulging (occurred briefly during PAT RN interview). She described the pain as "like a card being turned or rubbed against her chest." She goes to the gym weekly (Mondays; last 01/19/18) for an hour and rides a stationary bike, does 100 kettle bell lifts, and sets of squats and does not get chest pain with this level of activity. She also will do short jogs (ie, across parking lots), shovels, and gardens and does not get chest pain with this level of activity either. She was told she was prediabetic a few years ago, but has since lost 40 lbs and has not had any issues with her glucose readings since. She denied any known personal cardiac history. She does not smoke. Her father had an MI/CABG at age 17--she reports he was overweight and non-compliant. Patient denied current chest symptoms during my evaluation of her while at PAT.  EKG 01/28/18: NSR, incomplete right BBB. There are no comparison tracing.   Preoperative labs noted. BMET WNL. CBC WNL except mildly elevated PLT count of 414K. Glucose 98.  Patient's exercise tolerance is > 4 METS. She does not have exertional chest pain. She is not SOB. She is not diabetic or  a smoker. Based on her description, symptoms sound atypical for angina. If worsening or change in symptoms we discussed need further evaluation. Also reviewed with anesthesiologist Dr. Randell Patient Green---Recommend that Dr. Marlou Starks is aware since he is operating on her chest. If no additional recommendations or change in her symptoms then it is anticipated that she can proceed as planned; however, assigned anesthesiologist will evaluate on the day of surgery. I sent Dr. Marlou Starks and his MA Carlene Coria a staff message about this since Dr. Marlou Starks is out of the office  until 02/02/18. I also called and discussed with Dr. Harlow Mares. He also felt symptoms sounded atypical, but would discuss further with Dr. Marlou Starks.  [facility]/Anesthesiology Phone [phone] 01/28/2018 2:26 PM

## 2018-01-28 NOTE — Progress Notes (Addendum)
PCP - Dr. Kara Pacer  OB/GYN- [place]  Cardiologist - Denies  Chest x-ray - Denies  EKG - 01/28/18  Stress Test - Denies  ECHO - Denies  Cardiac Cath - Denies  Sleep Study - Denies CPAP - None  LABS- 01/28/18: CBC, BMP  Pt c/o left-sided chest discomfort during the appointment. Denied sob, nausea, or vomiting. EKG performed and PA Ebony Hail for anesthesia made aware. Pt's last dose of Aspirin was on 01/22/18.   Anesthesia- Yes- chest discomfort   All instructions explained to the pt, with a verbal understanding of the material. Pt agrees to go over the instructions while at home for a better understanding. The opportunity to ask questions was provided.

## 2018-02-02 ENCOUNTER — Ambulatory Visit: Payer: Self-pay | Admitting: General Surgery

## 2018-02-04 ENCOUNTER — Encounter (HOSPITAL_COMMUNITY): Payer: Self-pay | Admitting: Certified Registered Nurse Anesthetist

## 2018-02-04 NOTE — Anesthesia Preprocedure Evaluation (Addendum)
Anesthesia Evaluation  Patient identified by MRN, date of birth, ID band Patient awake    Reviewed: Allergy & Precautions, NPO status , Patient's Chart, lab work & pertinent test results  Airway Mallampati: II  TM Distance: >3 FB Neck ROM: Full    Dental  (+) Dental Advisory Given   Pulmonary neg pulmonary ROS,    Pulmonary exam normal breath sounds clear to auscultation       Cardiovascular Exercise Tolerance: Good negative cardio ROS Normal cardiovascular exam Rhythm:Regular Rate:Normal     Neuro/Psych negative neurological ROS  negative psych ROS   GI/Hepatic negative GI ROS, Neg liver ROS,   Endo/Other  Obesity  Renal/GU negative Renal ROS  negative genitourinary   Musculoskeletal negative musculoskeletal ROS (+)   Abdominal   Peds  Hematology negative hematology ROS (+)   Anesthesia Other Findings   Reproductive/Obstetrics                            Anesthesia Physical Anesthesia Plan  ASA: II  Anesthesia Plan: General   Post-op Pain Management:  Regional for Post-op pain   Induction: Intravenous  PONV Risk Score and Plan: 3 and Treatment may vary due to age or medical condition, Ondansetron, Dexamethasone, Scopolamine patch - Pre-op and Midazolam  Airway Management Planned: Oral ETT  Additional Equipment: Arterial line  Intra-op Plan:   Post-operative Plan: Extubation in OR  Informed Consent: I have reviewed the patients History and Physical, chart, labs and discussed the procedure including the risks, benefits and alternatives for the proposed anesthesia with the patient or authorized representative who has indicated his/her understanding and acceptance.   Dental advisory given  Plan Discussed with: CRNA and Anesthesiologist  Anesthesia Plan Comments: (Will place arterial line to both minimize risk of peripheral nerve injury from frequent BP cuff readings and to  allow blood draws for periodic labs during lengthy procedure.)       Anesthesia Quick Evaluation

## 2018-02-05 ENCOUNTER — Other Ambulatory Visit: Payer: Self-pay

## 2018-02-05 ENCOUNTER — Ambulatory Visit (HOSPITAL_COMMUNITY): Payer: BC Managed Care – PPO | Admitting: Certified Registered Nurse Anesthetist

## 2018-02-05 ENCOUNTER — Encounter (HOSPITAL_COMMUNITY): Payer: Self-pay

## 2018-02-05 ENCOUNTER — Ambulatory Visit (HOSPITAL_COMMUNITY)
Admission: RE | Admit: 2018-02-05 | Discharge: 2018-02-06 | Disposition: A | Discharge status: Home / Self Care | Payer: BC Managed Care – PPO | Source: Ambulatory Visit | Attending: Plastic Surgery | Admitting: Plastic Surgery

## 2018-02-05 ENCOUNTER — Encounter (HOSPITAL_COMMUNITY)
Admission: RE | Disposition: A | Discharge status: Home / Self Care | Payer: Self-pay | Source: Ambulatory Visit | Attending: General Surgery

## 2018-02-05 ENCOUNTER — Ambulatory Visit (HOSPITAL_COMMUNITY): Payer: BC Managed Care – PPO | Admitting: Vascular Surgery

## 2018-02-05 DIAGNOSIS — N6489 Other specified disorders of breast: Secondary | ICD-10-CM | POA: Insufficient documentation

## 2018-02-05 DIAGNOSIS — N6011 Diffuse cystic mastopathy of right breast: Secondary | ICD-10-CM | POA: Diagnosis not present

## 2018-02-05 DIAGNOSIS — N6092 Unspecified benign mammary dysplasia of left breast: Secondary | ICD-10-CM | POA: Insufficient documentation

## 2018-02-05 DIAGNOSIS — Z6832 Body mass index (BMI) 32.0-32.9, adult: Secondary | ICD-10-CM | POA: Diagnosis not present

## 2018-02-05 DIAGNOSIS — Z86010 Personal history of colonic polyps: Secondary | ICD-10-CM | POA: Diagnosis not present

## 2018-02-05 DIAGNOSIS — Z803 Family history of malignant neoplasm of breast: Secondary | ICD-10-CM | POA: Diagnosis present

## 2018-02-05 DIAGNOSIS — E669 Obesity, unspecified: Secondary | ICD-10-CM | POA: Diagnosis not present

## 2018-02-05 DIAGNOSIS — Z1501 Genetic susceptibility to malignant neoplasm of breast: Secondary | ICD-10-CM | POA: Diagnosis present

## 2018-02-05 DIAGNOSIS — N63 Unspecified lump in unspecified breast: Secondary | ICD-10-CM | POA: Insufficient documentation

## 2018-02-05 DIAGNOSIS — Z1589 Genetic susceptibility to other disease: Secondary | ICD-10-CM

## 2018-02-05 DIAGNOSIS — N6012 Diffuse cystic mastopathy of left breast: Secondary | ICD-10-CM | POA: Insufficient documentation

## 2018-02-05 DIAGNOSIS — N6091 Unspecified benign mammary dysplasia of right breast: Secondary | ICD-10-CM | POA: Diagnosis not present

## 2018-02-05 DIAGNOSIS — Z7982 Long term (current) use of aspirin: Secondary | ICD-10-CM | POA: Insufficient documentation

## 2018-02-05 DIAGNOSIS — Z885 Allergy status to narcotic agent status: Secondary | ICD-10-CM | POA: Insufficient documentation

## 2018-02-05 DIAGNOSIS — Z1502 Genetic susceptibility to malignant neoplasm of ovary: Secondary | ICD-10-CM

## 2018-02-05 DIAGNOSIS — Z1509 Genetic susceptibility to other malignant neoplasm: Secondary | ICD-10-CM

## 2018-02-05 HISTORY — PX: NIPPLE SPARING MASTECTOMY: SHX6537

## 2018-02-05 HISTORY — PX: BREAST RECONSTRUCTION: SHX9

## 2018-02-05 HISTORY — PX: BREAST RECONSTRUCTION WITH PLACEMENT OF TISSUE EXPANDER AND FLEX HD (ACELLULAR HYDRATED DERMIS): SHX6295

## 2018-02-05 LAB — POCT PREGNANCY, URINE: PREG TEST UR: NEGATIVE

## 2018-02-05 SURGERY — MASTECTOMY, NIPPLE SPARING
Anesthesia: General | Site: Breast | Laterality: Bilateral

## 2018-02-05 MED ORDER — DEXTROSE-NACL 5-0.45 % IV SOLN
INTRAVENOUS | Status: DC
  Administered 2018-02-05: 18:00:00 via INTRAVENOUS

## 2018-02-05 MED ORDER — LACTATED RINGERS IV SOLN
INTRAVENOUS | Status: DC | PRN
  Administered 2018-02-05 (×3): via INTRAVENOUS

## 2018-02-05 MED ORDER — FENTANYL CITRATE (PF) 100 MCG/2ML IJ SOLN
INTRAMUSCULAR | Status: DC | PRN
  Administered 2018-02-05: 25 ug via INTRAVENOUS
  Administered 2018-02-05: 50 ug via INTRAVENOUS
  Administered 2018-02-05 (×3): 25 ug via INTRAVENOUS
  Administered 2018-02-05 (×2): 50 ug via INTRAVENOUS

## 2018-02-05 MED ORDER — NEOSTIGMINE METHYLSULFATE 5 MG/5ML IV SOSY
PREFILLED_SYRINGE | INTRAVENOUS | Status: DC | PRN
  Administered 2018-02-05: 4 mg via INTRAVENOUS

## 2018-02-05 MED ORDER — CHLORHEXIDINE GLUCONATE CLOTH 2 % EX PADS: 6.0000 | MEDICATED_PAD | Freq: Once | CUTANEOUS | Status: DC

## 2018-02-05 MED ORDER — PHENYLEPHRINE 40 MCG/ML (10ML) SYRINGE FOR IV PUSH (FOR BLOOD PRESSURE SUPPORT)
PREFILLED_SYRINGE | INTRAVENOUS | Status: DC | PRN
  Administered 2018-02-05: 80 ug via INTRAVENOUS
  Administered 2018-02-05: 40 ug via INTRAVENOUS
  Administered 2018-02-05: 80 ug via INTRAVENOUS
  Administered 2018-02-05: 40 ug via INTRAVENOUS
  Administered 2018-02-05: 80 ug via INTRAVENOUS

## 2018-02-05 MED ORDER — FENTANYL CITRATE (PF) 250 MCG/5ML IJ SOLN
INTRAMUSCULAR | Status: AC
  Filled 2018-02-05: qty 5

## 2018-02-05 MED ORDER — ARTIFICIAL TEARS OPHTHALMIC OINT
TOPICAL_OINTMENT | OPHTHALMIC | Status: AC
  Filled 2018-02-05: qty 3.5

## 2018-02-05 MED ORDER — CEFAZOLIN SODIUM-DEXTROSE 2-4 GM/100ML-% IV SOLN
2.0000 g | INTRAVENOUS | Status: AC
  Administered 2018-02-05 (×2): 2 g via INTRAVENOUS

## 2018-02-05 MED ORDER — DEXAMETHASONE SODIUM PHOSPHATE 10 MG/ML IJ SOLN
INTRAMUSCULAR | Status: AC
  Filled 2018-02-05: qty 1

## 2018-02-05 MED ORDER — CEFAZOLIN SODIUM-DEXTROSE 1-4 GM/50ML-% IV SOLN
1.0000 g | Freq: Four times a day (QID) | INTRAVENOUS | Status: AC
  Administered 2018-02-06 (×2): 1 g via INTRAVENOUS
  Filled 2018-02-05 (×3): qty 50

## 2018-02-05 MED ORDER — CEFAZOLIN SODIUM-DEXTROSE 1-4 GM/50ML-% IV SOLN
1.0000 g | Freq: Four times a day (QID) | INTRAVENOUS | Status: DC
  Filled 2018-02-05 (×2): qty 50

## 2018-02-05 MED ORDER — BUPIVACAINE HCL (PF) 0.25 % IJ SOLN
INTRAMUSCULAR | Status: DC | PRN
  Administered 2018-02-05 (×2): 30 mL

## 2018-02-05 MED ORDER — DOCUSATE SODIUM 100 MG PO CAPS
100.0000 mg | ORAL_CAPSULE | Freq: Every day | ORAL | Status: DC
  Administered 2018-02-05 – 2018-02-06 (×2): 100 mg via ORAL
  Filled 2018-02-05: qty 1

## 2018-02-05 MED ORDER — MIDAZOLAM HCL 2 MG/2ML IJ SOLN
INTRAMUSCULAR | Status: AC
  Filled 2018-02-05: qty 2

## 2018-02-05 MED ORDER — PROMETHAZINE HCL 25 MG/ML IJ SOLN
6.2500 mg | INTRAMUSCULAR | Status: DC | PRN
Start: 2018-02-05 — End: 2018-02-06

## 2018-02-05 MED ORDER — SCOPOLAMINE 1 MG/3DAYS TD PT72
MEDICATED_PATCH | TRANSDERMAL | Status: DC | PRN
  Administered 2018-02-05: 1 via TRANSDERMAL

## 2018-02-05 MED ORDER — POVIDONE-IODINE 10 % EX SOLN
CUTANEOUS | Status: DC | PRN
  Administered 2018-02-05: 1 via TOPICAL

## 2018-02-05 MED ORDER — CEFAZOLIN SODIUM-DEXTROSE 2-4 GM/100ML-% IV SOLN: 2.0000 g | INTRAVENOUS | Status: DC

## 2018-02-05 MED ORDER — GABAPENTIN 300 MG PO CAPS
ORAL_CAPSULE | ORAL | Status: AC
  Administered 2018-02-05: 300 mg via ORAL
  Filled 2018-02-05: qty 1

## 2018-02-05 MED ORDER — LIDOCAINE 2% (20 MG/ML) 5 ML SYRINGE
INTRAMUSCULAR | Status: DC | PRN
  Administered 2018-02-05 (×2): 40 mg via INTRAVENOUS

## 2018-02-05 MED ORDER — PROPOFOL 10 MG/ML IV BOLUS
INTRAVENOUS | Status: DC | PRN
  Administered 2018-02-05: 50 mg via INTRAVENOUS
  Administered 2018-02-05: 130 mg via INTRAVENOUS

## 2018-02-05 MED ORDER — LIDOCAINE HCL (CARDIAC) 20 MG/ML IV SOLN
INTRAVENOUS | Status: AC
  Filled 2018-02-05: qty 5

## 2018-02-05 MED ORDER — PROMETHAZINE HCL 25 MG/ML IJ SOLN: 6.2500 mg | INTRAMUSCULAR | Status: DC | PRN

## 2018-02-05 MED ORDER — ACETAMINOPHEN 500 MG PO TABS
1000.0000 mg | ORAL_TABLET | ORAL | Status: AC
  Administered 2018-02-05: 1000 mg via ORAL

## 2018-02-05 MED ORDER — CELECOXIB 200 MG PO CAPS
200.0000 mg | ORAL_CAPSULE | ORAL | Status: AC
  Administered 2018-02-05: 200 mg via ORAL

## 2018-02-05 MED ORDER — DIAZEPAM 5 MG PO TABS: 5.0000 mg | ORAL_TABLET | Freq: Two times a day (BID) | ORAL | Status: DC | PRN

## 2018-02-05 MED ORDER — CELECOXIB 200 MG PO CAPS
ORAL_CAPSULE | ORAL | Status: AC
  Administered 2018-02-05: 200 mg via ORAL
  Filled 2018-02-05: qty 1

## 2018-02-05 MED ORDER — CEFAZOLIN SODIUM 1 G IJ SOLR
INTRAMUSCULAR | Status: AC
  Filled 2018-02-05: qty 20

## 2018-02-05 MED ORDER — HYDROMORPHONE HCL 1 MG/ML IJ SOLN
INTRAMUSCULAR | Status: AC
  Filled 2018-02-05: qty 0.5

## 2018-02-05 MED ORDER — DEXAMETHASONE SODIUM PHOSPHATE 4 MG/ML IJ SOLN
INTRAMUSCULAR | Status: DC | PRN
  Administered 2018-02-05: 10 mg via INTRAVENOUS

## 2018-02-05 MED ORDER — MORPHINE SULFATE (PF) 4 MG/ML IV SOLN: 1.0000 mg | INTRAVENOUS | Status: DC | PRN

## 2018-02-05 MED ORDER — HEPARIN SODIUM (PORCINE) 5000 UNIT/ML IJ SOLN
5000.0000 [IU] | Freq: Once | INTRAMUSCULAR | Status: AC
  Administered 2018-02-05: 5000 [IU] via SUBCUTANEOUS

## 2018-02-05 MED ORDER — HEPARIN SODIUM (PORCINE) 5000 UNIT/ML IJ SOLN
5000.0000 [IU] | Freq: Three times a day (TID) | INTRAMUSCULAR | Status: DC
  Administered 2018-02-06: 5000 [IU] via SUBCUTANEOUS
  Filled 2018-02-05: qty 1

## 2018-02-05 MED ORDER — KCL IN DEXTROSE-NACL 20-5-0.9 MEQ/L-%-% IV SOLN
INTRAVENOUS | Status: DC
  Administered 2018-02-05: 18:00:00 via INTRAVENOUS
  Filled 2018-02-05: qty 1000

## 2018-02-05 MED ORDER — ONDANSETRON HCL 4 MG/2ML IJ SOLN
INTRAMUSCULAR | Status: DC | PRN
  Administered 2018-02-05: 4 mg via INTRAVENOUS

## 2018-02-05 MED ORDER — HEPARIN SODIUM (PORCINE) 5000 UNIT/ML IJ SOLN
INTRAMUSCULAR | Status: AC
  Administered 2018-02-05: 5000 [IU] via SUBCUTANEOUS
  Filled 2018-02-05: qty 1

## 2018-02-05 MED ORDER — DEXAMETHASONE SODIUM PHOSPHATE 10 MG/ML IJ SOLN
INTRAMUSCULAR | Status: DC | PRN
  Administered 2018-02-05 (×2): 5 mg

## 2018-02-05 MED ORDER — ARTIFICIAL TEARS OPHTHALMIC OINT
TOPICAL_OINTMENT | OPHTHALMIC | Status: DC | PRN
  Administered 2018-02-05: 1 via OPHTHALMIC

## 2018-02-05 MED ORDER — SODIUM CHLORIDE 0.9 % IV SOLN
Freq: Once | INTRAVENOUS | Status: DC
  Filled 2018-02-05: qty 1

## 2018-02-05 MED ORDER — ROCURONIUM BROMIDE 10 MG/ML (PF) SYRINGE
PREFILLED_SYRINGE | INTRAVENOUS | Status: DC | PRN
  Administered 2018-02-05: 20 mg via INTRAVENOUS
  Administered 2018-02-05: 70 mg via INTRAVENOUS
  Administered 2018-02-05: 10 mg via INTRAVENOUS

## 2018-02-05 MED ORDER — MIDAZOLAM HCL 5 MG/5ML IJ SOLN
INTRAMUSCULAR | Status: DC | PRN
  Administered 2018-02-05: 2 mg via INTRAVENOUS

## 2018-02-05 MED ORDER — PROPOFOL 10 MG/ML IV BOLUS
INTRAVENOUS | Status: AC
  Filled 2018-02-05: qty 40

## 2018-02-05 MED ORDER — ROCURONIUM BROMIDE 10 MG/ML (PF) SYRINGE
PREFILLED_SYRINGE | INTRAVENOUS | Status: AC
  Filled 2018-02-05: qty 5

## 2018-02-05 MED ORDER — ONDANSETRON HCL 4 MG/2ML IJ SOLN
INTRAMUSCULAR | Status: AC
  Filled 2018-02-05: qty 2

## 2018-02-05 MED ORDER — NEOSTIGMINE METHYLSULFATE 5 MG/5ML IV SOSY
PREFILLED_SYRINGE | INTRAVENOUS | Status: AC
  Filled 2018-02-05: qty 5

## 2018-02-05 MED ORDER — ACETAMINOPHEN 500 MG PO TABS
ORAL_TABLET | ORAL | Status: AC
  Administered 2018-02-05: 1000 mg via ORAL
  Filled 2018-02-05: qty 2

## 2018-02-05 MED ORDER — HYDROCODONE-ACETAMINOPHEN 5-325 MG PO TABS
1.0000 | ORAL_TABLET | ORAL | Status: DC | PRN
  Administered 2018-02-05 – 2018-02-06 (×5): 2 via ORAL
  Filled 2018-02-05 (×5): qty 2

## 2018-02-05 MED ORDER — PHENYLEPHRINE HCL 10 MG/ML IJ SOLN
INTRAVENOUS | Status: DC | PRN
  Administered 2018-02-05: 10 ug/min via INTRAVENOUS

## 2018-02-05 MED ORDER — CEFAZOLIN SODIUM-DEXTROSE 2-4 GM/100ML-% IV SOLN
INTRAVENOUS | Status: AC
  Filled 2018-02-05: qty 100

## 2018-02-05 MED ORDER — SCOPOLAMINE 1 MG/3DAYS TD PT72
MEDICATED_PATCH | TRANSDERMAL | Status: AC
  Filled 2018-02-05: qty 1

## 2018-02-05 MED ORDER — HYDROMORPHONE HCL 1 MG/ML IJ SOLN
INTRAMUSCULAR | Status: DC | PRN
  Administered 2018-02-05: .25 mg via INTRAVENOUS
  Administered 2018-02-05: 0.5 mg via INTRAVENOUS
  Administered 2018-02-05 (×2): .25 mg via INTRAVENOUS

## 2018-02-05 MED ORDER — METHOCARBAMOL 500 MG PO TABS: 500.0000 mg | ORAL_TABLET | Freq: Four times a day (QID) | ORAL | Status: DC | PRN

## 2018-02-05 MED ORDER — SODIUM CHLORIDE 0.9 % IV SOLN
INTRAVENOUS | Status: DC | PRN
  Administered 2018-02-05: 08:00:00

## 2018-02-05 MED ORDER — GABAPENTIN 300 MG PO CAPS
300.0000 mg | ORAL_CAPSULE | ORAL | Status: AC
  Administered 2018-02-05: 300 mg via ORAL

## 2018-02-05 MED ORDER — EPHEDRINE SULFATE-NACL 50-0.9 MG/10ML-% IV SOSY
PREFILLED_SYRINGE | INTRAVENOUS | Status: DC | PRN
  Administered 2018-02-05 (×2): 5 mg via INTRAVENOUS
  Administered 2018-02-05 (×2): 2.5 mg via INTRAVENOUS

## 2018-02-05 MED ORDER — 0.9 % SODIUM CHLORIDE (POUR BTL) OPTIME
TOPICAL | Status: DC | PRN
  Administered 2018-02-05: 1000 mL

## 2018-02-05 MED ORDER — FENTANYL CITRATE (PF) 100 MCG/2ML IJ SOLN: 25.0000 ug | INTRAMUSCULAR | Status: DC | PRN

## 2018-02-05 MED ORDER — GLYCOPYRROLATE 0.2 MG/ML IV SOSY
PREFILLED_SYRINGE | INTRAVENOUS | Status: DC | PRN
  Administered 2018-02-05: 0.6 mg via INTRAVENOUS

## 2018-02-05 SURGICAL SUPPLY — 91 items
ADH SKN CLS APL DERMABOND .7 (GAUZE/BANDAGES/DRESSINGS) ×2
APPLIER CLIP 9.375 MED OPEN (MISCELLANEOUS) ×6
APR CLP MED 9.3 20 MLT OPN (MISCELLANEOUS) ×2
ATCH SMKEVC FLXB CAUT HNDSWH (FILTER) ×1 IMPLANT
BAG DECANTER FOR FLEXI CONT (MISCELLANEOUS) ×3 IMPLANT
BINDER BREAST LRG (GAUZE/BANDAGES/DRESSINGS) IMPLANT
BINDER BREAST XLRG (GAUZE/BANDAGES/DRESSINGS) IMPLANT
BIOPATCH RED 1 DISK 7.0 (GAUZE/BANDAGES/DRESSINGS) ×4 IMPLANT
BIOPATCH RED 1IN DISK 7.0MM (GAUZE/BANDAGES/DRESSINGS) ×2
BLADE 10 SAFETY STRL DISP (BLADE) ×3 IMPLANT
BLADE SURG 15 STRL LF DISP TIS (BLADE) IMPLANT
BLADE SURG 15 STRL SS (BLADE) ×3
CANISTER SUCT 3000ML PPV (MISCELLANEOUS) ×6 IMPLANT
CHLORAPREP W/TINT 26ML (MISCELLANEOUS) ×6 IMPLANT
CLIP APPLIE 9.375 MED OPEN (MISCELLANEOUS) ×1 IMPLANT
CONT SPEC 4OZ CLIKSEAL STRL BL (MISCELLANEOUS) ×1 IMPLANT
COVER SURGICAL LIGHT HANDLE (MISCELLANEOUS) ×4 IMPLANT
DERMABOND ADVANCED (GAUZE/BANDAGES/DRESSINGS) ×4
DERMABOND ADVANCED .7 DNX12 (GAUZE/BANDAGES/DRESSINGS) ×2 IMPLANT
DEVICE DISSECT PLASMABLAD 3.0S (MISCELLANEOUS) IMPLANT
DRAIN CHANNEL 19F RND (DRAIN) ×7 IMPLANT
DRAPE HALF SHEET 40X57 (DRAPES) ×7 IMPLANT
DRAPE ORTHO SPLIT 77X108 STRL (DRAPES) ×6
DRAPE SURG 17X23 STRL (DRAPES) ×10 IMPLANT
DRAPE SURG ORHT 6 SPLT 77X108 (DRAPES) ×2 IMPLANT
DRAPE U-SHAPE 76X120 STRL (DRAPES) ×6 IMPLANT
DRAPE WARM FLUID 44X44 (DRAPE) ×3 IMPLANT
DRSG PAD ABDOMINAL 8X10 ST (GAUZE/BANDAGES/DRESSINGS) ×10 IMPLANT
DRSG SORBAVIEW 3.5X5-5/16 MED (GAUZE/BANDAGES/DRESSINGS) ×6 IMPLANT
ELECT BLADE 4.0 EZ CLEAN MEGAD (MISCELLANEOUS) ×3
ELECT BLADE 6.5 EXT (BLADE) IMPLANT
ELECT CAUTERY BLADE 6.4 (BLADE) ×6 IMPLANT
ELECT REM PT RETURN 9FT ADLT (ELECTROSURGICAL) ×6
ELECTRODE BLDE 4.0 EZ CLN MEGD (MISCELLANEOUS) ×1 IMPLANT
ELECTRODE REM PT RTRN 9FT ADLT (ELECTROSURGICAL) ×2 IMPLANT
EVACUATOR SILICONE 100CC (DRAIN) ×7 IMPLANT
EVACUATOR SMOKE ACCUVAC VALLEY (FILTER) ×2
EXPANDER BREAST 650CC (Breast) ×2 IMPLANT
EXPANDER BREAST TISSUE 650CC (Breast) ×2 IMPLANT
GAUZE SPONGE 4X4 12PLY STRL (GAUZE/BANDAGES/DRESSINGS) ×4 IMPLANT
GLOVE BIO SURGEON STRL SZ7.5 (GLOVE) ×6 IMPLANT
GLOVE BIOGEL PI IND STRL 7.5 (GLOVE) IMPLANT
GLOVE BIOGEL PI IND STRL 8 (GLOVE) ×1 IMPLANT
GLOVE BIOGEL PI INDICATOR 7.5 (GLOVE) ×2
GLOVE BIOGEL PI INDICATOR 8 (GLOVE) ×2
GLOVE SURG SS PI 7.5 STRL IVOR (GLOVE) ×2 IMPLANT
GOWN STRL REUS W/ TWL LRG LVL3 (GOWN DISPOSABLE) ×3 IMPLANT
GOWN STRL REUS W/ TWL XL LVL3 (GOWN DISPOSABLE) ×1 IMPLANT
GOWN STRL REUS W/TWL LRG LVL3 (GOWN DISPOSABLE) ×12
GOWN STRL REUS W/TWL XL LVL3 (GOWN DISPOSABLE) ×3
GRAFT FLEX HD 4X16 THICK (Tissue Mesh) ×4 IMPLANT
KIT BASIN OR (CUSTOM PROCEDURE TRAY) ×6 IMPLANT
KIT TURNOVER KIT B (KITS) ×4 IMPLANT
LIGHT WAVEGUIDE WIDE FLAT (MISCELLANEOUS) IMPLANT
MARKER SKIN DUAL TIP RULER LAB (MISCELLANEOUS) ×3 IMPLANT
NDL 18GX1X1/2 (RX/OR ONLY) (NEEDLE) IMPLANT
NDL 21 GA WING INFUSION (NEEDLE) IMPLANT
NDL FILTER BLUNT 18X1 1/2 (NEEDLE) IMPLANT
NDL HYPO 25GX1X1/2 BEV (NEEDLE) IMPLANT
NEEDLE 18GX1X1/2 (RX/OR ONLY) (NEEDLE) IMPLANT
NEEDLE 21 GA WING INFUSION (NEEDLE) ×3 IMPLANT
NEEDLE FILTER BLUNT 18X 1/2SAF (NEEDLE)
NEEDLE FILTER BLUNT 18X1 1/2 (NEEDLE) IMPLANT
NEEDLE HYPO 25GX1X1/2 BEV (NEEDLE) ×3 IMPLANT
NS IRRIG 1000ML POUR BTL (IV SOLUTION) ×7 IMPLANT
PACK GENERAL/GYN (CUSTOM PROCEDURE TRAY) ×6 IMPLANT
PAD ARMBOARD 7.5X6 YLW CONV (MISCELLANEOUS) ×6 IMPLANT
PLASMABLADE 3.0S (MISCELLANEOUS) ×3
PREFILTER EVAC NS 1 1/3-3/8IN (MISCELLANEOUS) ×3 IMPLANT
SET ASEPTIC TRANSFER (MISCELLANEOUS) ×2 IMPLANT
SPECIMEN JAR X LARGE (MISCELLANEOUS) ×3 IMPLANT
SPONGE GAUZE 4X4 12PLY STER LF (GAUZE/BANDAGES/DRESSINGS) ×1 IMPLANT
SPONGE LAP 18X18 5 PK (GAUZE/BANDAGES/DRESSINGS) ×4 IMPLANT
STAPLER VISISTAT 35W (STAPLE) ×2 IMPLANT
SUT ETHILON 2 0 FS 18 (SUTURE) ×1 IMPLANT
SUT MNCRL AB 3-0 PS2 18 (SUTURE) ×16 IMPLANT
SUT MNCRL AB 4-0 PS2 18 (SUTURE) ×3 IMPLANT
SUT PDS AB 3-0 SH 27 (SUTURE) ×10 IMPLANT
SUT PROLENE 3 0 PS 2 (SUTURE) ×6 IMPLANT
SUT SILK 2 0 SH (SUTURE) ×2 IMPLANT
SUT VIC AB 3-0 54X BRD REEL (SUTURE) ×1 IMPLANT
SUT VIC AB 3-0 BRD 54 (SUTURE) ×3
SUT VIC AB 3-0 SH 18 (SUTURE) ×6 IMPLANT
SYR BULB IRRIGATION 50ML (SYRINGE) ×3 IMPLANT
SYR CONTROL 10ML LL (SYRINGE) IMPLANT
TOWEL OR 17X24 6PK STRL BLUE (TOWEL DISPOSABLE) ×6 IMPLANT
TOWEL OR 17X26 10 PK STRL BLUE (TOWEL DISPOSABLE) ×6 IMPLANT
TRAY FOLEY BAG SILVER LF 14FR (CATHETERS) ×2 IMPLANT
TRAY FOLEY CATH SILVER 16FR (SET/KITS/TRAYS/PACK) IMPLANT
TUBE CONNECTING 12'X1/4 (SUCTIONS) ×2
TUBE CONNECTING 12X1/4 (SUCTIONS) ×3 IMPLANT

## 2018-02-05 NOTE — Anesthesia Procedure Notes (Signed)
Anesthesia Regional Block: Pectoralis block   Pre-Anesthetic Checklist: ,, timeout performed, Correct Patient, Correct Site, Correct Laterality, Correct Procedure, Correct Position, site marked, Risks and benefits discussed,  Surgical consent,  Pre-op evaluation,  At surgeon's request and post-op pain management  Laterality: Right  Prep: chloraprep       Needles:  Injection technique: Single-shot  Needle Type: Echogenic Needle     Needle Length: 10cm  Needle Gauge: 21     Additional Needles:   Narrative:  Start time: 02/05/2018 7:15 AM End time: 02/05/2018 7:19 AM Injection made incrementally with aspirations every 5 mL.  Performed by: Personally  Anesthesiologist: Audry Pili, MD  Additional Notes: No pain on injection. No increased resistance to injection. Injection made in 5cc increments. Good needle visualization. Patient tolerated the procedure well.

## 2018-02-05 NOTE — Op Note (Signed)
02/05/2018  10:03 AM  PATIENT:  [name] [name]  47 y.o. female  PRE-OPERATIVE DIAGNOSIS:  CHEK 2 MUTATION POSITIVE  POST-OPERATIVE DIAGNOSIS:  CHEK 2 MUTATION POSITIVE  PROCEDURE:  Procedure(s): BILATERAL Skin SPARING MASTECTOMIES (Bilateral)  SURGEON:  Surgeon(s) and Role: Panel 1:    * Jovita Kussmaul, MD - Primary  PHYSICIAN ASSISTANT:   ASSISTANTS: sharon Hitchcock, RNFA   ANESTHESIA:   general  EBL:  50 mL   BLOOD ADMINISTERED:none  DRAINS: none   LOCAL MEDICATIONS USED:  NONE  SPECIMEN:  Source of Specimen:  bilateral mastectomies  DISPOSITION OF SPECIMEN:  PATHOLOGY  COUNTS:  YES  TOURNIQUET:  * No tourniquets in log *  DICTATION: .Dragon Dictation   After informed consent was obtained the patient was brought to the operating room and placed in the supine position on the operating table.  After adequate induction of general anesthesia the patient's bilateral chest, breast, and axillary areas were prepped with ChloraPrep, allowed to dry, and draped in usual sterile manner.  An appropriate timeout was performed.  Attention was first turned to the right breast.  An elliptical incision was made around the nipple and areole a complex in order to spare the skin.  The incision was carried through the skin and subcutaneous tissue sharply with the plasma blade.  Breast hooks were used to elevate the skin flaps anteriorly towards the ceiling.  Thin skin flaps were then created circumferentially between the breast tissue in the subcutaneous fat.  This dissection was carried all the way to the chest wall circumferentially.  Next the breast was removed from the pectoralis muscle with the pectoralis fascia.  Once the breast was removed it was oriented with a stitch on the lateral skin and sent to pathology for further evaluation.  Hemostasis was achieved using the plasma blade.  Several small vessels and lymphatics laterally were controlled with clips.  The wound was then packed with a  moistened lap sponge and covered with a sterile green towel.  Attention was then turned to the left breast.  A similar elliptical incision was made around the nipple and areole a complex with a 10 blade knife.  The incision was carried through the skin and subcutaneous tissue sharply with the plasma blade.  Breast hooks were used to elevate the skin flaps anteriorly towards the ceiling.  Thin skin flaps were then created circumferentially between the breast tissue in the subcutaneous fatty tissue.  This dissection was carried all the way to the chest wall circumferentially.  Next the breast was removed from the pectoralis muscle with the pectoralis fascia.  Once this was accomplished the breast was removed from the patient and oriented with a stitch on the lateral skin.  The breast was then sent to pathology for further evaluation.  Hemostasis was achieved using the plasma blade.  Several small vessels and lymphatics laterally were controlled with clips.  The wound was then packed with a moistened lap sponge.  The wound was covered with a sterile green towel.  At this point the patient had tolerated the procedure well.  All needle sponge and instrument counts were correct.  The patient was then turned over to Dr. Harlow Mares for the reconstruction portion.  This will be dictated separately.  PLAN OF CARE: Admit for overnight observation  PATIENT DISPOSITION:  PACU - hemodynamically stable.   Delay start of Pharmacological VTE agent (>24hrs) due to surgical blood loss or risk of bleeding: no

## 2018-02-05 NOTE — Transfer of Care (Signed)
Immediate Anesthesia Transfer of Care Note  Patient: [name] [name]  Procedure(s) Performed: BILATERAL Skin SPARING MASTECTOMIES (Bilateral Breast) BILATERAL BREAST RECONSTRUCTION WITH PLACEMENT OF TISSUE EXPANDER AND FLEX HD (ACELLULAR HYDRATED DERMIS) (Bilateral Breast)  Patient Location: PACU  Anesthesia Type:General and Regional  Level of Consciousness: awake, patient cooperative and responds to stimulation  Airway & Oxygen Therapy: Patient Spontanous Breathing and Patient connected to nasal cannula oxygen  Post-op Assessment: Report given to RN, Post -op Vital signs reviewed and stable and Patient moving all extremities X 4  Post vital signs: Reviewed and stable  Last Vitals:  Vitals Value Taken Time  BP 116/68 02/05/2018 12:17 PM  Temp    Pulse 93 02/05/2018 12:18 PM  Resp 29 02/05/2018 12:18 PM  SpO2 100 % 02/05/2018 12:18 PM  Vitals shown include unvalidated device data.  Last Pain:  Vitals:   02/05/18 0623  TempSrc:   PainSc: 0-No pain         Complications: No apparent anesthesia complications

## 2018-02-05 NOTE — Anesthesia Procedure Notes (Signed)
Arterial Line Insertion Start/End3/28/2019 7:45 AM, 02/05/2018 7:50 AM Performed by: Verdie Drown, CRNA, CRNA  Patient location: OR. Preanesthetic checklist: patient identified, IV checked, site marked, risks and benefits discussed, surgical consent, monitors and equipment checked, pre-op evaluation, timeout performed and anesthesia consent Left, radial was placed Catheter size: 20 G Hand hygiene performed  and maximum sterile barriers used   Attempts: 1 Procedure performed without using ultrasound guided technique. Following insertion, dressing applied. Post procedure assessment: normal  Patient tolerated the procedure well with no immediate complications.

## 2018-02-05 NOTE — Anesthesia Postprocedure Evaluation (Signed)
Anesthesia Post Note  Patient: [name] [name]  Procedure(s) Performed: BILATERAL Skin SPARING MASTECTOMIES (Bilateral Breast) BILATERAL BREAST RECONSTRUCTION WITH PLACEMENT OF TISSUE EXPANDER AND FLEX HD (ACELLULAR HYDRATED DERMIS) (Bilateral Breast)     Patient location during evaluation: PACU Anesthesia Type: General Level of consciousness: awake and alert Pain management: pain level controlled Vital Signs Assessment: post-procedure vital signs reviewed and stable Respiratory status: spontaneous breathing, nonlabored ventilation and respiratory function stable Cardiovascular status: blood pressure returned to baseline and stable Postop Assessment: no apparent nausea or vomiting Anesthetic complications: no    Last Vitals:  Vitals:   02/05/18 1500 02/05/18 1527  BP: (!) 115/94 115/62  Pulse: 99 88  Resp: 20 20  Temp: 37.2 C 36.8 C  SpO2: 100% 99%    Last Pain:  Vitals:   02/05/18 1624  TempSrc:   PainSc: 4                  Audry Pili

## 2018-02-05 NOTE — Brief Op Note (Signed)
02/05/2018  12:01 PM  PATIENT:  [name] [name]  47 y.o. female  PRE-OPERATIVE DIAGNOSIS:  CHEK 2 MUTATION POSITIVE  POST-OPERATIVE DIAGNOSIS:  CHEK 2 MUTATION POSITIVE  PROCEDURE:  Procedure(s): BILATERAL Skin SPARING MASTECTOMIES (Bilateral) BILATERAL BREAST RECONSTRUCTION WITH PLACEMENT OF TISSUE EXPANDER AND FLEX HD (ACELLULAR HYDRATED DERMIS) (Bilateral)  SURGEON:  Surgeon(s) and Role: Panel 1:    * Jovita Kussmaul, MD - Primary Panel 2:    * Crissie Reese, MD - Primary  PHYSICIAN ASSISTANT:   ASSISTANTS: Judyann Munson, RNFA   ANESTHESIA:   general  EBL:  60 mL   BLOOD ADMINISTERED:none  DRAINS: One 69 French Blake each chest space   LOCAL MEDICATIONS USED:  NONE  SPECIMEN:  No Specimen  DISPOSITION OF SPECIMEN:  N/A  COUNTS:  YES  TOURNIQUET:  * No tourniquets in log *  DICTATION: .Other Dictation: Dictation Number [id number]  PLAN OF CARE: Admit for overnight observation  PATIENT DISPOSITION:  PACU - hemodynamically stable.   Delay start of Pharmacological VTE agent (>24hrs) due to surgical blood loss or risk of bleeding: no

## 2018-02-05 NOTE — Anesthesia Procedure Notes (Signed)
Anesthesia Regional Block: Pectoralis block   Pre-Anesthetic Checklist: ,, timeout performed, Correct Patient, Correct Site, Correct Laterality, Correct Procedure, Correct Position, site marked, Risks and benefits discussed,  Surgical consent,  Pre-op evaluation,  At surgeon's request and post-op pain management  Laterality: Left  Prep: chloraprep       Needles:  Injection technique: Single-shot  Needle Type: Echogenic Needle     Needle Length: 10cm  Needle Gauge: 21     Additional Needles:   Narrative:  Start time: 02/05/2018 7:09 AM End time: 02/05/2018 7:13 AM Injection made incrementally with aspirations every 5 mL.  Performed by: Personally  Anesthesiologist: Audry Pili, MD  Additional Notes: No pain on injection. No increased resistance to injection. Injection made in 5cc increments. Good needle visualization. Patient tolerated the procedure well.

## 2018-02-05 NOTE — H&P (Signed)
[name] [name]  Location: [facility] Patient #: [id number] DOB: 04/26/1971 Married / Language: English / Race: White Female   History of Present Illness  The patient is a 46 year old female who presents with a complaint of Breast problems. We are asked to see the patient in consultation by Dr. McComb to evaluate her for a CHEK2 mutation. The patient is a 46-year-old white female who has a very strong history of breast cancer in her family. There is breast cancer in her mother, 3 maternal aunts, and one maternal cousin. She denies any breast pain or discharge from the nipple. She did recently undergo genetic testing which was positive for a CHEK2 mutation. This mutation probably puts her lifetime risk of breast cancer between 30 and 40% and certainly with her strong family history she is probably on the high end of that range. She presents today because she is interested in what she can do for risk reduction and would like to discuss the options.   Past Surgical History  Colon Polyp Removal - Open  Knee Surgery  Bilateral. Oral Surgery   Diagnostic Studies History Colonoscopy  1-5 years ago  Allergies  No Known Drug Allergies   Medication History No Current Medications Medications Reconciled  Social History Caffeine use  Carbonated beverages, Coffee, Tea. No alcohol use  No drug use  Tobacco use  Never smoker.  Family History Breast Cancer  Family Members In General, Mother. Cerebrovascular Accident  Father. Colon Cancer  Family Members In General. Diabetes Mellitus  Father. Heart Disease  Father. Hypertension  Father.  Pregnancy / Birth History Gravida  2 Length (months) of breastfeeding  3-6 Maternal age  21-25 Para  2 Regular periods   Other Problems  No pertinent past medical history     Review of Systems  General Not Present- Appetite Loss, Chills, Fatigue, Fever, Night Sweats, Weight Gain and Weight Loss. Skin Not  Present- Change in Wart/Mole, Dryness, Hives, Jaundice, New Lesions, Non-Healing Wounds, Rash and Ulcer. HEENT Not Present- Earache, Hearing Loss, Hoarseness, Nose Bleed, Oral Ulcers, Ringing in the Ears, Seasonal Allergies, Sinus Pain, Sore Throat, Visual Disturbances, Wears glasses/contact lenses and Yellow Eyes. Breast Present- Breast Pain. Not Present- Breast Mass, Nipple Discharge and Skin Changes. Cardiovascular Not Present- Chest Pain, Difficulty Breathing Lying Down, Leg Cramps, Palpitations, Rapid Heart Rate, Shortness of Breath and Swelling of Extremities. Gastrointestinal Not Present- Abdominal Pain, Bloating, Bloody Stool, Change in Bowel Habits, Chronic diarrhea, Constipation, Difficulty Swallowing, Excessive gas, Gets full quickly at meals, Hemorrhoids, Indigestion, Nausea, Rectal Pain and Vomiting. Female Genitourinary Not Present- Frequency, Nocturia, Painful Urination, Pelvic Pain and Urgency. Musculoskeletal Not Present- Back Pain, Joint Pain, Joint Stiffness, Muscle Pain, Muscle Weakness and Swelling of Extremities. Neurological Not Present- Decreased Memory, Fainting, Headaches, Numbness, Seizures, Tingling, Tremor, Trouble walking and Weakness. Psychiatric Not Present- Anxiety, Bipolar, Change in Sleep Pattern, Depression, Fearful and Frequent crying. Hematology Not Present- Blood Thinners, Easy Bruising, Excessive bleeding, Gland problems, HIV and Persistent Infections.  Vitals  Weight: 203.38 lb Height: 66in Body Surface Area: 2.01 m Body Mass Index: 32.83 kg/m  BP: 118/80 (Sitting, Left Arm, Standard)       Physical Exam General Mental Status-Alert. General Appearance-Consistent with stated age. Hydration-Well hydrated. Voice-Normal.  Head and Neck Head-normocephalic, atraumatic with no lesions or palpable masses. Trachea-midline. Thyroid Gland Characteristics - normal size and consistency.  Eye Eyeball - Bilateral-Extraocular  movements intact. Sclera/Conjunctiva - Bilateral-No scleral icterus.  Chest and Lung Exam Chest and lung exam   reveals -quiet, even and easy respiratory effort with no use of accessory muscles and on auscultation, normal breath sounds, no adventitious sounds and normal vocal resonance. Inspection Chest Wall - Normal. Back - normal.  Breast Note: There is no palpable mass in either breast. There is no palpable axillary, supraclavicular, or cervical lymphadenopathy.   Cardiovascular Cardiovascular examination reveals -normal heart sounds, regular rate and rhythm with no murmurs and normal pedal pulses bilaterally.  Abdomen Inspection Inspection of the abdomen reveals - No Hernias. Skin - Scar - no surgical scars. Palpation/Percussion Palpation and Percussion of the abdomen reveal - Soft, Non Tender, No Rebound tenderness, No Rigidity (guarding) and No hepatosplenomegaly. Auscultation Auscultation of the abdomen reveals - Bowel sounds normal.  Neurologic Neurologic evaluation reveals -alert and oriented x 3 with no impairment of recent or remote memory. Mental Status-Normal.  Musculoskeletal Normal Exam - Left-Upper Extremity Strength Normal and Lower Extremity Strength Normal. Normal Exam - Right-Upper Extremity Strength Normal and Lower Extremity Strength Normal.  Lymphatic Head & Neck  General Head & Neck Lymphatics: Bilateral - Description - Normal. Axillary  General Axillary Region: Bilateral - Description - Normal. Tenderness - Non Tender. Femoral & Inguinal  Generalized Femoral & Inguinal Lymphatics: Bilateral - Description - Normal. Tenderness - Non Tender.    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 This mutation along with her family history puts her risk of breast cancer in the 40% range. We have talked in  detail today about risk reduction. She is interested in pursuing bilateral mastectomies. I will plan to refer her to plastic surgery as well as to the high risk clinic at the cancer center. She should continue to do regular self exams. I will plan to be seen her back for surveillance in 6 months. She will still need yearly mammograms and MRI studies until such time as she has surgery. She would be a candidate for nipple sparing mastectomy. I have discussed with her in detail the risk and benefits of the operation as well as some of the technical aspects and she understands. She will let us know when she is ready to schedule. Current Plans Follow up with [place] in the office in 6 MONTHS.   Call us sooner as needed.

## 2018-02-05 NOTE — Interval H&P Note (Signed)
History and Physical Interval Note:  02/05/2018 7:22 AM  [name] [name]  has presented today for surgery, with the diagnosis of CHEK 2 MUTATION POSITIVE  The various methods of treatment have been discussed with the patient and family. After consideration of risks, benefits and other options for treatment, the patient has consented to  Procedure(s): BILATERAL Skin SPARING MASTECTOMIES (Bilateral) BILATERAL BREAST RECONSTRUCTION WITH PLACEMENT OF TISSUE EXPANDER AND FLEX HD (ACELLULAR HYDRATED DERMIS) (Bilateral) as a surgical intervention .  The patient's history has been reviewed, patient examined, no change in status, stable for surgery.  I have reviewed the patient's chart and labs.  Questions were answered to the patient's satisfaction.     TOTH III,Kenwood Rosiak S

## 2018-02-06 ENCOUNTER — Encounter (HOSPITAL_COMMUNITY): Payer: Self-pay | Admitting: General Surgery

## 2018-02-06 DIAGNOSIS — Z1501 Genetic susceptibility to malignant neoplasm of breast: Secondary | ICD-10-CM | POA: Diagnosis not present

## 2018-02-06 NOTE — Discharge Instructions (Addendum)
No lifting for 6 weeks No vigorous activity for 6 weeks (including outdoor walks) No driving for 4 weeks OK to walk up stairs slowly Stay propped up Use incentive spirometer at home every hour while awake No shower while drains are in place Empty drains at least three times a day and record the amounts separately Change drain dressings every third day if instructed to do so by Dr. Harlow Mares  Apply Bacitracin antibiotic ointment to the drain sites  Place gauze dressing over drains  Secure the gauze with tape Take an over-the-counter Probiotic while on antibiotics Take an over-the-counter stool softener (such as Colace) while on pain medication No lifting for 6 weeks No vigorous activity for 6 weeks (including outdoor walks) No driving for 4 weeks OK to walk up stairs slowly Stay propped up Use incentive spirometer at home every hour while awake No shower while drains are in place Empty drains at least four times a day and record the amounts separately Change drain dressings every third day if instructed to do so by Dr. Harlow Mares (No need to do this yet)  Apply Bacitracin antibiotic ointment to the drain sites  Place gauze dressing over drains  Secure the gauze with tape Take an over-the-counter Probiotic while on antibiotics Take an over-the-counter stool softener (such as Colace) while on pain medication See Dr. Harlow Mares next week in the office For questions call [phone] or [phone]

## 2018-02-06 NOTE — Discharge Summary (Signed)
Physician Discharge Summary  Patient ID: [name] [name] MRN: [id number] DOB/AGE: 02/13/1971 46 y.o.  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 The patient tolerated the procedures well. Postoperatively, the mastectomy flaps maintained excellent color and capillary refill. The patient was ambulatory and tolerating diet on the first postoperative day. She would like to be discharged.  Treatments: antibiotics: Ancef, anticoagulation: heparin and surgery: bilateral mastectomy and breast reconstruction with tissue expanders  Discharge Exam: Blood pressure 101/61, pulse 71, temperature 98 F (36.7 C), temperature source Oral, resp. rate 18, height 5' 6" (1.676 m), weight 198 lb 6.4 oz (90 kg), last menstrual period 01/09/2018, SpO2 100 %.  Operative sites: Mastectomy flaps have excellent color. Tissue expanders are in good position. Drains functioning. Drainage thin.  Disposition: Discharge disposition: 01-Home or Self Care        Allergies as of 02/06/2018      Reactions   Oxycodone    Causes "BAD" feeling, nausea, paranoid      Medication List    STOP taking these medications   aspirin EC 81 MG tablet   ibuprofen 600 MG tablet Commonly known as:  ADVIL,MOTRIN   JUICE PLUS FIBRE PO   NON FORMULARY     TAKE these medications   acetaminophen 500 MG tablet Commonly known as:  TYLENOL Take 1,000 mg by mouth every 6 (six) hours as needed (for pain/headaches.).   diazepam 5 MG tablet Commonly known as:  VALIUM Take 5 mg by mouth every 12 (twelve)  hours as needed for anxiety.   Vitamin D3 3000 units Tabs Take 3,000 Units by mouth at bedtime.        Signed: ,  M 02/06/2018, 9:02 AM  

## 2018-02-06 NOTE — Progress Notes (Signed)
Discharge paperwork reviewed with patient. No questions verbalized. Patient is ready for discharge.  

## 2018-02-06 NOTE — Progress Notes (Signed)
1 Day Post-Op   Subjective/Chief Complaint: Complains of soreness but seems manageable   Objective: Vital signs in last 24 hours: Temp:  [98 F (36.7 C)-99.4 F (37.4 C)] 98 F (36.7 C) (03/29 0537) Pulse Rate:  [67-99] 71 (03/29 0537) Resp:  [18-28] 18 (03/29 0537) BP: (99-116)/(50-94) 101/61 (03/29 0537) SpO2:  [99 %-100 %] 100 % (03/29 0537)    Intake/Output from previous day: 03/28 0701 - 03/29 0700 In: 4370 [P.O.:120; I.V.:4200; IV Piggyback:50] Out: 8768 [Urine:3870; Drains:222; Blood:60] Intake/Output this shift: No intake/output data recorded.  General appearance: alert and cooperative Resp: clear to auscultation bilaterally Chest wall: skin flaps look healthy Cardio: regular rate and rhythm GI: soft, non-tender; bowel sounds normal; no masses,  no organomegaly  Lab Results:  No results for input([name] [name]): WBC, HGB, HCT, PLT in the last 72 hours. BMET No results for input([name] [name]): NA, K, CL, CO2, GLUCOSE, BUN, CREATININE, CALCIUM in the last 72 hours. PT/INR No results for input([name] [name]): LABPROT, INR in the last 72 hours. ABG No results for input([name] [name]): PHART, HCO3 in the last 72 hours.  Invalid input([name] [name]): PCO2, PO2  Studies/Results: No results found.  Anti-infectives: Anti-infectives (From admission, onward)   Start     Dose/Rate Route Frequency Ordered Stop   02/05/18 1800  ceFAZolin (ANCEF) IVPB 1 g/50 mL premix  Status:  Discontinued     1 g 100 mL/hr over 30 Minutes Intravenous Every 6 hours 02/05/18 1538 02/05/18 1547   02/05/18 1800  ceFAZolin (ANCEF) IVPB 1 g/50 mL premix     1 g 100 mL/hr over 30 Minutes Intravenous Every 6 hours 02/05/18 1547 02/06/18 0615   02/05/18 0818  bacitracin 50,000 Units, gentamicin (GARAMYCIN) 80 mg, ceFAZolin (ANCEF) 1 g in sodium chloride 0.9 % 1,000 mL  Status:  Discontinued       As needed 02/05/18 0819 02/05/18 1212   02/05/18 0715  bacitracin 50,000 Units, gentamicin (GARAMYCIN) 80 mg, ceFAZolin (ANCEF) 1 g in sodium chloride 0.9  % 1,000 mL      Irrigation Once 02/05/18 0706     02/05/18 0558  ceFAZolin (ANCEF) IVPB 2g/100 mL premix     2 g 200 mL/hr over 30 Minutes Intravenous On call to O.R. 02/05/18 1157 02/05/18 1144   02/05/18 0558  ceFAZolin (ANCEF) IVPB 2g/100 mL premix  Status:  Discontinued     2 g 200 mL/hr over 30 Minutes Intravenous On call to O.R. 02/05/18 0558 02/05/18 0642      Assessment/Plan: [name] [name]/p Procedure([name] [name]): BILATERAL Skin SPARING MASTECTOMIES (Bilateral) BILATERAL BREAST RECONSTRUCTION WITH PLACEMENT OF TISSUE EXPANDER AND FLEX HD (ACELLULAR HYDRATED DERMIS) (Bilateral) Advance diet Discharge  LOS: 0 days    TOTH [name] [name],[name] [name] [name] 02/06/2018

## 2018-02-06 NOTE — Op Note (Signed)
NAME:  [name] [name], [name] [name]                       ACCOUNT NO.:  MEDICAL RECORD NO.:  [id number]  LOCATION:                                 FACILITY:  PHYSICIAN:  Crissie Reese, M.D.     DATE OF BIRTH:  18-Aug-1971  DATE OF PROCEDURE:  02/05/2018 DATE OF DISCHARGE:                              OPERATIVE REPORT   PREOPERATIVE DIAGNOSES: 1. Positive CHEK2 gene. 2. Strong family history of breast cancer.  POSTOPERATIVE DIAGNOSES: 1. Positive CHEK2 gene. 2. Strong family history of breast cancer.  PROCEDURES PERFORMED: 1. Bilateral breast reconstruction with tissue expander. 2. Bilateral chest wall reconstruction with more tna 50 cm sq of acellular dermal matrix on each side as a distinct procedure.  SURGEONS:  Crissie Reese, M.D.  ASSISTANT:  Judyann Munson, RNFA.  ANESTHESIA:  General.  ESTIMATED BLOOD LOSS:  25 mL.  DRAINS:  One 19-French on each side.  CLINICAL NOTE:  This 47 year old woman has a strong family history of breast cancer and also is positive for the CHEK2 gene and has been counseled very carefully by the oncologist.  She elected to proceed with bilateral mastectomy due to significant risk for developing breast cancer.  She was interested in reconstruction and after extensive discussion regarding options, she elected to have reconstruction with a tissue expanders as a stage procedure for eventual placement of implants.  The nature of these procedures and risks plus complications were discussed with her in great detail.  Risks include but are not limited to bleeding, infection, healing problems, scarring, loss of sensation, fluid accumulations, anesthesia complications, pneumothorax, DVT, PE, failure of device, capsular contracture, displacement of device, wrinkles and ripples, chronic pain, contour deformities, especially at the periphery of the reconstruction, asymmetry, and overall disappointment.  She understood all of this and wished to proceed.  DESCRIPTION  OF PROCEDURE:  General Surgery had completed the bilateral mastectomy.  The mastectomy flaps were inspected and found to be in excellent condition and appeared to have excellent vascularity.  The mastectomy space was irrigated thoroughly with saline and then the pectoralis muscles were elevated.  Release was performed along the inframammary fold only releasing the muscle layer and preserving the inframammary fold.  Hemostasis with electrocautery.  Thorough irrigation with saline as well as antibiotic solution.  The acellular dermal matrix was soaked in saline for greater than 15 minutes and then cleansed thoroughly and then also soaked in antibiotic solution for greater than 10 minutes.  The ADM was pie crusted and these pieces were 4 cm x 16 cm for each side.  After thoroughly cleaning gloves, the expanders were prepared.  These were MenTor minutes CPX 4 tall height tissue expanders. 150 mL of sterile saline placed using a closed filling system.  All of the air removed.  These expanders were also soaked in antibiotic solution as well.  The mastectomy space as well as the submuscular space were then inspected and hemostasis was achieved using electrocautery. After thoroughly cleaning gloves, the tissue expanders were then positioned on each side submuscular and the lateral suture tape secured with a 3-0 Vicryl suture.  The ADM was then positioned on each side with  the epidermal side facing down towards the tissue expander and the dermis facing up towards the overlying mastectomy flaps.  These pieces of ADM were then secured using 3-0 PDS simple interrupted sutures with great care taken to avoid damage to underlying tissue expanders which were kept under direct vision at all times.  A little bit of excess acellular dermal matrix was then trimmed at the superior aspect and a small window was left superiorly for drainage.  The ADM had also been pie-crusted prior to positioning as well.  A  19-French drain was positioned on the side, brought out through the separate stab wound inferolaterally and secured with a 3-0 Prolene suture.  Meticulous hemostasis was again confirmed and the skin closure with 3-0 Monocryl interrupted inverted deep dermal sutures.  Dermabond and dry sterile dressings for the drains were applied and she was placed in the breast vest and transported to the recovery room stable having tolerated the procedure well.     Crissie Reese, M.D.     DB/MEDQ  D:  02/05/2018  T:  02/06/2018  Job:  [id number]  cc:   Crissie Reese, M.D.'s office

## 2018-03-17 ENCOUNTER — Other Ambulatory Visit: Payer: Self-pay | Admitting: *Deleted

## 2018-03-17 ENCOUNTER — Ambulatory Visit: Payer: BC Managed Care – PPO | Admitting: Sports Medicine

## 2018-03-17 ENCOUNTER — Encounter: Payer: Self-pay | Admitting: Sports Medicine

## 2018-03-17 DIAGNOSIS — M79672 Pain in left foot: Secondary | ICD-10-CM

## 2018-03-17 DIAGNOSIS — L84 Corns and callosities: Secondary | ICD-10-CM | POA: Diagnosis not present

## 2018-03-17 DIAGNOSIS — L853 Xerosis cutis: Secondary | ICD-10-CM

## 2018-03-17 DIAGNOSIS — M79671 Pain in right foot: Secondary | ICD-10-CM

## 2018-03-17 NOTE — Progress Notes (Signed)
Subjective: [name] [name] is a 47 y.o. female patient who presents to office for evaluation of mild foot pain secondary to callus and dry skin. Patient complains of pain at the heels and sometimes when areas build up and get a hard core to them. Patient denies any other pedal complaints.   Patient to have breast reconstruction in Aug/Sept and is s/p bilateral preventative mastectomy   Patient Active Problem List   Diagnosis Date Noted  . 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 POLYP, COLON 08/05/2007  . HEMORRHOIDS, INTERNAL 08/05/2007    Current Outpatient Medications on File Prior to Visit  Medication Sig Dispense Refill  . acetaminophen (TYLENOL) 500 MG tablet Take 1,000 mg by mouth every 6 (six) hours as needed (for pain/headaches.).    Marland Kitchen Cholecalciferol (VITAMIN D3) 3000 units TABS Take 3,000 Units by mouth at bedtime.    . diazepam (VALIUM) 5 MG tablet Take 5 mg by mouth every 12 (twelve) hours as needed for anxiety.    Marland Kitchen ibuprofen (ADVIL,MOTRIN) 200 MG tablet Take by mouth.     No current facility-administered medications on file prior to visit.     Allergies  Allergen Reactions  . Oxycodone Nausea And Vomiting and Other (See Comments)    Causes "BAD" feeling, nausea, paranoid    Objective:  General: Alert and oriented x3 in no acute distress  Dermatology:Diffuse keratotic skin in patches with skin lines transversing the lesions, no webspace macerations, no ecchymosis bilateral, all nails x 10 are well manicured.  Vascular: Dorsalis Pedis and Posterior Tibial pedal pulses 2/4, Capillary Fill Time 3 seconds, + pedal hair growth bilateral, no edema bilateral lower extremities, Temperature gradient within normal limits.  Neurology: Johney Maine sensation intact via light touch bilateral.  Musculoskeletal: Minimal tenderness with palpation at the keratotic lesion sites bilateral.  Muscular strength 5/5 in all groups without pain or limitation on range of motion. No lower extremity muscular or boney deformity noted.  Assessment and Plan: Problem List Items Addressed This Visit    None    Visit Diagnoses    Dry skin    -  Primary   Heel callus       Foot pain, bilateral         -Complete examination performed -Discussed treatment options for dry skin -Recommend Revitadem or home treatments of vasaline and lemon to dry skin area -Encouraged use of pumice stone -Advised good supportive shoes and inserts -Patient to return to office in 5 months for follow up on dry skin or sooner if condition worsens.  Landis Martins, DPM

## 2019-04-25 DIAGNOSIS — R079 Chest pain, unspecified: Secondary | ICD-10-CM | POA: Insufficient documentation
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
## 2019-04-26 ENCOUNTER — Ambulatory Visit: Payer: BC Managed Care – PPO | Admitting: Cardiology

## 2019-04-26 ENCOUNTER — Encounter: Payer: Self-pay | Admitting: Cardiology

## 2019-04-26 ENCOUNTER — Other Ambulatory Visit: Payer: Self-pay

## 2019-04-26 VITALS — BP 114/64 | HR 69 | Ht 66.0 in | Wt 225.7 lb

## 2019-04-26 DIAGNOSIS — R0789 Other chest pain: Secondary | ICD-10-CM

## 2019-04-26 DIAGNOSIS — Z9013 Acquired absence of bilateral breasts and nipples: Secondary | ICD-10-CM | POA: Diagnosis not present

## 2019-04-26 DIAGNOSIS — Z8249 Family history of ischemic heart disease and other diseases of the circulatory system: Secondary | ICD-10-CM

## 2019-04-26 DIAGNOSIS — R079 Chest pain, unspecified: Secondary | ICD-10-CM

## 2020-09-07 ENCOUNTER — Emergency Department (HOSPITAL_BASED_OUTPATIENT_CLINIC_OR_DEPARTMENT_OTHER)
Admission: EM | Admit: 2020-09-07 | Discharge: 2020-09-07 | Disposition: A | Discharge status: Home / Self Care | Payer: BC Managed Care – PPO | Attending: Emergency Medicine | Admitting: Emergency Medicine

## 2020-09-07 ENCOUNTER — Other Ambulatory Visit: Payer: Self-pay

## 2020-09-07 ENCOUNTER — Emergency Department (HOSPITAL_BASED_OUTPATIENT_CLINIC_OR_DEPARTMENT_OTHER): Payer: BC Managed Care – PPO

## 2020-09-07 ENCOUNTER — Other Ambulatory Visit (HOSPITAL_BASED_OUTPATIENT_CLINIC_OR_DEPARTMENT_OTHER): Payer: Self-pay | Admitting: Physician Assistant

## 2020-09-07 ENCOUNTER — Encounter (HOSPITAL_BASED_OUTPATIENT_CLINIC_OR_DEPARTMENT_OTHER): Payer: Self-pay

## 2020-09-07 DIAGNOSIS — Z96651 Presence of right artificial knee joint: Secondary | ICD-10-CM | POA: Insufficient documentation

## 2020-09-07 DIAGNOSIS — Z86010 Personal history of colonic polyps: Secondary | ICD-10-CM | POA: Diagnosis not present

## 2020-09-07 DIAGNOSIS — M79621 Pain in right upper arm: Secondary | ICD-10-CM | POA: Diagnosis present

## 2020-09-07 DIAGNOSIS — M7918 Myalgia, other site: Secondary | ICD-10-CM

## 2020-09-07 MED ORDER — NAPROXEN 500 MG PO TABS: 500.0000 mg | ORAL_TABLET | Freq: Two times a day (BID) | ORAL | 0 refills | Status: DC

## 2020-09-07 MED ORDER — METHOCARBAMOL 750 MG PO TABS: 750.0000 mg | ORAL_TABLET | Freq: Every evening | ORAL | 0 refills | Status: DC | PRN

## 2020-09-07 MED FILL — METHOCARBAMOL 750 MG TABS: 750 | 5 days supply | Qty: 5 | Fill #0

## 2020-09-07 MED FILL — NAPROXEN 500 MG TABS: 500 | 7 days supply | Qty: 14 | Fill #0
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
## 2020-09-07 NOTE — Discharge Instructions (Addendum)
You may alternate taking Tylenol and Naproxen as needed for pain control. You may take Naproxen twice daily as directed on your discharge paperwork and you may take  [phone] mg of Tylenol every 6 hours. Do not exceed 4000 mg of Tylenol daily as this can lead to liver damage. Also, make sure to take Naproxen with meals as it can cause an upset stomach. Do not take other NSAIDs while taking Naproxen such as (Aleve, Ibuprofen, Aspirin, Celebrex, etc) and do not take more than the prescribed dose as this can lead to ulcers and bleeding in your GI tract. You may use warm and cold compresses to help with your symptoms.   You were given a prescription for Robaxin which is a muscle relaxer.  You should not drive, work, or operate machinery while taking this medication as it can make you very drowsy.    Please follow up with your primary doctor within the next 7-10 days for re-evaluation and further treatment of your symptoms.   Please return to the ER sooner if you have any new or worsening symptoms.

## 2020-09-07 NOTE — ED Notes (Signed)
ED Provider at bedside. 

## 2020-10-11 ENCOUNTER — Other Ambulatory Visit: Payer: Self-pay | Admitting: General Surgery

## 2020-10-11 DIAGNOSIS — T85848A Pain due to other internal prosthetic devices, implants and grafts, initial encounter: Secondary | ICD-10-CM

## 2020-11-23 ENCOUNTER — Ambulatory Visit
Admission: RE | Admit: 2020-11-23 | Discharge: 2020-11-23 | Disposition: A | Discharge status: Home / Self Care | Payer: BC Managed Care – PPO | Source: Ambulatory Visit | Attending: General Surgery | Admitting: General Surgery

## 2020-11-23 ENCOUNTER — Other Ambulatory Visit: Payer: Self-pay

## 2020-11-23 DIAGNOSIS — T85848A Pain due to other internal prosthetic devices, implants and grafts, initial encounter: Secondary | ICD-10-CM

## 2020-11-23 MED ORDER — GADOBUTROL 1 MMOL/ML IV SOLN
10.0000 mL | Freq: Once | INTRAVENOUS | Status: AC | PRN
  Administered 2020-11-23: 10 mL via INTRAVENOUS

## 2021-06-13 LAB — BASIC METABOLIC PANEL
BUN: 16 (ref 4–21)
CO2: 31 — AB (ref 13–22)
Creatinine: 0.9 (ref <=1.1)
Glucose: 86

## 2021-06-13 LAB — HEPATIC FUNCTION PANEL
ALT: 11 (ref 7–35)
AST: 14 (ref 13–35)
Alkaline Phosphatase: 72 (ref 25–125)
Bilirubin, Total: 0.4

## 2021-06-13 LAB — COMPREHENSIVE METABOLIC PANEL
Albumin: 4.2 (ref 3.5–5.0)
Calcium: 9.5 (ref 8.7–10.7)

## 2021-06-13 LAB — TSH: TSH: 2.11 (ref <=5.90)

## 2021-06-13 LAB — VITAMIN B12: Vitamin B-12: 273

## 2021-06-13 LAB — VITAMIN D 25 HYDROXY (VIT D DEFICIENCY, FRACTURES): Vit D, 25-Hydroxy: 66.9

## 2021-07-02 LAB — CBC AND DIFFERENTIAL
HCT: 40 (ref 36–46)
Hemoglobin: 12.6 (ref 12.0–16.0)
Neutrophils Absolute: 68
Platelets: 398 (ref 150–399)
WBC: 7.5

## 2021-07-02 LAB — CBC: RBC: 4.58 (ref 3.87–5.11)

## 2021-11-13 ENCOUNTER — Other Ambulatory Visit: Payer: Self-pay

## 2021-11-13 ENCOUNTER — Ambulatory Visit: Payer: BC Managed Care – PPO | Admitting: Nurse Practitioner

## 2021-11-13 ENCOUNTER — Encounter: Payer: Self-pay | Admitting: Nurse Practitioner

## 2021-11-13 VITALS — BP 102/61 | HR 62 | Temp 99.2°F | Ht 66.0 in | Wt 218.9 lb

## 2021-11-13 DIAGNOSIS — M1712 Unilateral primary osteoarthritis, left knee: Secondary | ICD-10-CM | POA: Diagnosis not present

## 2021-11-13 DIAGNOSIS — Z6838 Body mass index (BMI) 38.0-38.9, adult: Secondary | ICD-10-CM | POA: Insufficient documentation

## 2021-11-13 DIAGNOSIS — F418 Other specified anxiety disorders: Secondary | ICD-10-CM | POA: Diagnosis not present

## 2021-11-13 DIAGNOSIS — Z6837 Body mass index (BMI) 37.0-37.9, adult: Secondary | ICD-10-CM | POA: Insufficient documentation

## 2021-11-13 DIAGNOSIS — Z6835 Body mass index (BMI) 35.0-35.9, adult: Secondary | ICD-10-CM | POA: Diagnosis not present

## 2021-11-13 DIAGNOSIS — Z7689 Persons encountering health services in other specified circumstances: Secondary | ICD-10-CM | POA: Diagnosis not present

## 2021-11-13 NOTE — Progress Notes (Signed)
New Patient Office Visit  Subjective:  Patient ID: [name] [name], female    DOB: 06/22/1971  Age: 51 y.o. MRN: [id number]  CC:  Chief Complaint  Patient presents with   New Patient (Initial Visit)    HPI [name] [name] presents to establish new primary care provider. She is scheduled to have left knee replacement on 11/30/2021. She had blood work done at [place] this morning for preoperative assessment. She will be coming back on Thursday for an ECG and official pre operative assessment.  The patient has had a risk management assessment. She has a high risk for developing breast cancer and an elevated risk for developing colon cancer. She states she recently had colonoscopy. She did have a single, precancerous polyp removed. She is to repeat her colonoscopy in 5 years. She states that this increased risk causes her to have increased anxiety. She states that this causes her to have difficulty sleeping at night. Will sometimes have chest pain. She does have a prescription for clonazepam 1mg  - she takes 0.5 tablet at night and only on some nights if needed. She does not need a new prescription for this at this time. She did have radical mastectomy and breast reconstruction surgery to prevent development of breast cancer in the future.  She does go to [facility]. She is taking several vitamin s and supplements due to recommendations from that clinic. She was also diagnosed with chronic epstein barr virus. Has been prescribed valacyclovir which she has not taken up to this point.  She does see a gynecologist for breast exams and pelvic exams.  Patient reports no current concerns or complaints today.   [name] woke up during knee surgery in 2017   Constipation    uses suppositories prn   Family history of breast cancer    10/11 maternal aunts with breast cancer. Patient has undergone prophylactic b/l mastectomy    Fatigue    Juice plus and testosterone helps    Past Surgical History:  Procedure Laterality Date   BREAST RECONSTRUCTION Bilateral 02/05/2018   Performed: BILATERAL Skin SPARING MASTECTOMIES (Bilateral Breast)   BREAST RECONSTRUCTION WITH PLACEMENT OF TISSUE EXPANDER AND FLEX HD (ACELLULAR HYDRATED DERMIS) Bilateral 02/05/2018   Procedure: BILATERAL BREAST RECONSTRUCTION WITH PLACEMENT OF TISSUE EXPANDER AND FLEX HD (ACELLULAR HYDRATED DERMIS);  Surgeon: Crissie Reese, MD;  Location: [facility];  Service: Plastics;  Laterality: Bilateral;   COLONOSCOPY     NIPPLE SPARING MASTECTOMY Bilateral 02/05/2018   Procedure: BILATERAL Skin SPARING MASTECTOMIES;  Surgeon: Jovita Kussmaul, MD;  Location: [place] [facility];  Service: General;  Laterality: Bilateral;   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   torn meniscus  2015   left leg/ right knee in 2016   WISDOM TOOTH EXTRACTION     in mid 20's    Family History  Problem Relation Age of Onset   Colon cancer Maternal Grandmother    Breast cancer Mother        10/11 maternal aunts had breast cancer   Heart disease Father        MI at age 42   Stroke Father    Colon cancer Maternal Uncle    Heart disease Paternal Uncle        MI at age 61   Esophageal cancer Neg Hx    Stomach cancer Neg Hx    Rectal cancer Neg Hx  Social History   Socioeconomic History   Marital status: Married    Spouse name: Not on file   Number of children: 2   Years of education: Not on file   Highest education level: Not on file  Occupational History   Not on file  Tobacco Use   Smoking status: Never   Smokeless tobacco: Never  Vaping Use   Vaping Use: Never used  Substance and Sexual Activity   Alcohol use: Yes    Comment: rarely   Drug use: No   Sexual activity: Yes    Comment: husband had vasectomy  Other Topics Concern   Not on file  Social History Narrative   Not on file   Social Determinants of Health   Financial Resource Strain: Not on file  Food  Insecurity: Not on file  Transportation Needs: Not on file  Physical Activity: Not on file  Stress: Not on file  Social Connections: Not on file  Intimate Partner Violence: Not on file    ROS Review of Systems  Constitutional:  Positive for fatigue. Negative for activity change, appetite change, chills and fever.  HENT:  Negative for congestion, postnasal drip, rhinorrhea, sinus pressure, sinus pain, sneezing and sore throat.   Eyes: Negative.   Respiratory:  Negative for cough, chest tightness, shortness of breath and wheezing.   Cardiovascular:  Negative for chest pain and palpitations.  Gastrointestinal:  Negative for abdominal pain, constipation, diarrhea, nausea and vomiting.  Endocrine: Negative for cold intolerance, heat intolerance, polydipsia and polyuria.  Genitourinary:  Negative for dyspareunia, dysuria, flank pain, frequency and urgency.  Musculoskeletal:  Positive for arthralgias. Negative for back pain and myalgias.       Chronic left knee pain. Due to have left knee replacement on 11/30/2021  Skin:  Negative for rash.  Allergic/Immunologic: Negative for environmental allergies.  Neurological:  Negative for dizziness, weakness and headaches.  Hematological:  Negative for adenopathy.  Psychiatric/Behavioral:  The patient is not nervous/anxious.    Objective:   Today's Vitals   11/13/21 1046  BP: 102/61  Pulse: 62  Temp: 99.2 F (37.3 C)  SpO2: 98%  Weight: 218 lb 14.4 oz (99.3 kg)  Height: 5\' 6"  (1.676 m)   Body mass index is 35.33 kg/m.   Physical Exam Vitals and nursing note reviewed.  Constitutional:      Appearance: Normal appearance. She is well-developed. She is obese.  HENT:     Head: Normocephalic and atraumatic.  Eyes:     Pupils: Pupils are equal, round, and reactive to light.  Cardiovascular:     Rate and Rhythm: Normal rate and regular rhythm.     Pulses: Normal pulses.     Heart sounds: Normal heart sounds.  Pulmonary:     Effort:  Pulmonary effort is normal.     Breath sounds: Normal breath sounds.  Abdominal:     Palpations: Abdomen is soft.  Musculoskeletal:        General: Normal range of motion.     Cervical back: Normal range of motion and neck supple.  Lymphadenopathy:     Cervical: No cervical adenopathy.  Skin:    General: Skin is warm and dry.     Capillary Refill: Capillary refill takes less than 2 seconds.  Neurological:     General: No focal deficit present.     Mental Status: She is alert and oriented to person, place, and time.  Psychiatric:        Mood and Affect: Mood  normal.        Behavior: Behavior normal.        Thought Content: Thought content normal.        Judgment: Judgment normal.    Assessment & Plan:  1. Encounter to establish care Appointment today to establish new primary care provider. Will get progress notes from prior PCP and GYN to review and update patient chart.   2. Primary osteoarthritis of left knee Patient scheduled for total left knee replacement on 11/30/2021. Preoperative appointment this coming Thursday.   3. Body mass index (BMI) of 35.0-35.9 in adult Discussed lowering calorie intake to 1500 calories per day and incorporating exercise into daily routine to help lose weight.   Problem List Items Addressed This Visit       Musculoskeletal and Integument   Primary osteoarthritis of left knee   Relevant Medications   meloxicam (MOBIC) 15 MG tablet   acetaminophen (TYLENOL) 500 MG tablet     Other   Body mass index (BMI) of 35.0-35.9 in adult   Other Visit Diagnoses     Encounter to establish care    -  Primary       Outpatient Encounter Medications as of 11/13/2021  Medication Sig   acetaminophen (TYLENOL) 500 MG tablet Take 500 mg by mouth every 6 (six) hours as needed.   Cholecalciferol (VITAMIN D3) 3000 units TABS Take 3,000 Units by mouth at bedtime.   clonazePAM (KLONOPIN) 1 MG tablet Take 0.5 tablets by mouth at bedtime.   meloxicam (MOBIC) 15  MG tablet Take 15 mg by mouth daily.   cimetidine (TAGAMET) 200 MG tablet Take 200 mg by mouth as needed. (Patient not taking: Reported on 11/13/2021)   phenylephrine (NEO-SYNEPHRINE) 1 % nasal spray Place 1 drop into both nostrils every 6 (six) hours as needed for congestion. (Patient not taking: Reported on 11/13/2021)   No facility-administered encounter medications on file as of 11/13/2021.    Follow-up: Return for as scheduled - preoerative clearance. need medical records from Dr. Rex Kras - see below.   Ronnell Freshwater, NP

## 2021-11-13 NOTE — Patient Instructions (Signed)
Calorie Counting for Weight Loss °Calories are units of energy. Your body needs a certain number of calories from food to keep going throughout the day. When you eat or drink more calories than your body needs, your body stores the extra calories mostly as fat. When you eat or drink fewer calories than your body needs, your body burns fat to get the energy it needs. °Calorie counting means keeping track of how many calories you eat and drink each day. Calorie counting can be helpful if you need to lose weight. If you eat fewer calories than your body needs, you should lose weight. Ask your health care provider what a healthy weight is for you. °For calorie counting to work, you will need to eat the right number of calories each day to lose a healthy amount of weight per week. A dietitian can help you figure out how many calories you need in a day and will suggest ways to reach your calorie goal. °A healthy amount of weight to lose each week is usually 1-2 lb (0.5-0.9 kg). This usually means that your daily calorie intake should be reduced by 500-750 calories. °Eating 1,200-1,500 calories a day can help most women lose weight. °Eating 1,500-1,800 calories a day can help most men lose weight. °What do I need to know about calorie counting? °Work with your health care provider or dietitian to determine how many calories you should get each day. To meet your daily calorie goal, you will need to: °Find out how many calories are in each food that you would like to eat. Try to do this before you eat. °Decide how much of the food you plan to eat. °Keep a food log. Do this by writing down what you ate and how many calories it had. °To successfully lose weight, it is important to balance calorie counting with a healthy lifestyle that includes regular activity. °Where do I find calorie information? °The number of calories in a food can be found on a Nutrition Facts label. If a food does not have a Nutrition Facts label, try to  look up the calories online or ask your dietitian for help. °Remember that calories are listed per serving. If you choose to have more than one serving of a food, you will have to multiply the calories per serving by the number of servings you plan to eat. For example, the label on a package of bread might say that a serving size is 1 slice and that there are 90 calories in a serving. If you eat 1 slice, you will have eaten 90 calories. If you eat 2 slices, you will have eaten 180 calories. °How do I keep a food log? °After each time that you eat, record the following in your food log as soon as possible: °What you ate. Be sure to include toppings, sauces, and other extras on the food. °How much you ate. This can be measured in cups, ounces, or number of items. °How many calories were in each food and drink. °The total number of calories in the food you ate. °Keep your food log near you, such as in a pocket-sized notebook or on an app or website on your mobile phone. Some programs will calculate calories for you and show you how many calories you have left to meet your daily goal. °What are some portion-control tips? °Know how many calories are in a serving. This will help you know how many servings you can have of a certain food. °  Use a measuring cup to measure serving sizes. You could also try weighing out portions on a kitchen scale. With time, you will be able to estimate serving sizes for some foods. °Take time to put servings of different foods on your favorite plates or in your favorite bowls and cups so you know what a serving looks like. °Try not to eat straight from a food's packaging, such as from a bag or box. Eating straight from the package makes it hard to see how much you are eating and can lead to overeating. Put the amount you would like to eat in a cup or on a plate to make sure you are eating the right portion. °Use smaller plates, glasses, and bowls for smaller portions and to prevent  overeating. °Try not to multitask. For example, avoid watching TV or using your computer while eating. If it is time to eat, sit down at a table and enjoy your food. This will help you recognize when you are full. It will also help you be more mindful of what and how much you are eating. °What are tips for following this plan? °Reading food labels °Check the calorie count compared with the serving size. The serving size may be smaller than what you are used to eating. °Check the source of the calories. Try to choose foods that are high in protein, fiber, and vitamins, and low in saturated fat, trans fat, and sodium. °Shopping °Read nutrition labels while you shop. This will help you make healthy decisions about which foods to buy. °Pay attention to nutrition labels for low-fat or fat-free foods. These foods sometimes have the same number of calories or more calories than the full-fat versions. They also often have added sugar, starch, or salt to make up for flavor that was removed with the fat. °Make a grocery list of lower-calorie foods and stick to it. °Cooking °Try to cook your favorite foods in a healthier way. For example, try baking instead of frying. °Use low-fat dairy products. °Meal planning °Use more fruits and vegetables. One-half of your plate should be fruits and vegetables. °Include lean proteins, such as chicken, turkey, and fish. °Lifestyle °Each week, aim to do one of the following: °150 minutes of moderate exercise, such as walking. °75 minutes of vigorous exercise, such as running. °General information °Know how many calories are in the foods you eat most often. This will help you calculate calorie counts faster. °Find a way of tracking calories that works for you. Get creative. Try different apps or programs if writing down calories does not work for you. °What foods should I eat? ° °Eat nutritious foods. It is better to have a nutritious, high-calorie food, such as an avocado, than a food with  few nutrients, such as a bag of potato chips. °Use your calories on foods and drinks that will fill you up and will not leave you hungry soon after eating. °Examples of foods that fill you up are nuts and nut butters, vegetables, lean proteins, and high-fiber foods such as whole grains. High-fiber foods are foods with more than 5 g of fiber per serving. °Pay attention to calories in drinks. Low-calorie drinks include water and unsweetened drinks. °The items listed above may not be a complete list of foods and beverages you can eat. Contact a dietitian for more information. °What foods should I limit? °Limit foods or drinks that are not good sources of vitamins, minerals, or protein or that are high in unhealthy fats. These include: °  Candy. °Other sweets. °Sodas, specialty coffee drinks, alcohol, and juice. °The items listed above may not be a complete list of foods and beverages you should avoid. Contact a dietitian for more information. °How do I count calories when eating out? °Pay attention to portions. Often, portions are much larger when eating out. Try these tips to keep portions smaller: °Consider sharing a meal instead of getting your own. °If you get your own meal, eat only half of it. Before you start eating, ask for a container and put half of your meal into it. °When available, consider ordering smaller portions from the menu instead of full portions. °Pay attention to your food and drink choices. Knowing the way food is cooked and what is included with the meal can help you eat fewer calories. °If calories are listed on the menu, choose the lower-calorie options. °Choose dishes that include vegetables, fruits, whole grains, low-fat dairy products, and lean proteins. °Choose items that are boiled, broiled, grilled, or steamed. Avoid items that are buttered, battered, fried, or served with cream sauce. Items labeled as crispy are usually fried, unless stated otherwise. °Choose water, low-fat milk,  unsweetened iced tea, or other drinks without added sugar. If you want an alcoholic beverage, choose a lower-calorie option, such as a glass of wine or light beer. °Ask for dressings, sauces, and syrups on the side. These are usually high in calories, so you should limit the amount you eat. °If you want a salad, choose a garden salad and ask for grilled meats. Avoid extra toppings such as bacon, cheese, or fried items. Ask for the dressing on the side, or ask for olive oil and vinegar or lemon to use as dressing. °Estimate how many servings of a food you are given. Knowing serving sizes will help you be aware of how much food you are eating at restaurants. °Where to find more information °Centers for Disease Control and Prevention: www.cdc.gov °[place] Department of Agriculture: myplate.gov °Summary °Calorie counting means keeping track of how many calories you eat and drink each day. If you eat fewer calories than your body needs, you should lose weight. °A healthy amount of weight to lose per week is usually 1-2 lb (0.5-0.9 kg). This usually means reducing your daily calorie intake by 500-750 calories. °The number of calories in a food can be found on a Nutrition Facts label. If a food does not have a Nutrition Facts label, try to look up the calories online or ask your dietitian for help. °Use smaller plates, glasses, and bowls for smaller portions and to prevent overeating. °Use your calories on foods and drinks that will fill you up and not leave you hungry shortly after a meal. °This information is not intended to replace advice given to you by your health care provider. Make sure you discuss any questions you have with your health care provider. °Document Revised: 12/09/2019 Document Reviewed: 12/09/2019 °Elsevier Patient Education © 2022 [employer] ° °Fat and Cholesterol Restricted Eating Plan °Getting too much fat and cholesterol in your diet may cause health problems. Choosing the right foods helps keep  your fat and cholesterol at normal levels. This can keep you from getting certain diseases. °Your doctor may recommend an eating plan that includes: °Total fat: ______% or less of total calories a day. This is ______g of fat a day. °Saturated fat: ______% or less of total calories a day. This is ______g of saturated fat a day. °Cholesterol: less than _________mg a day. °Fiber: ______g a   day. °What are tips for following this plan? °General tips °Work with your doctor to lose weight if you need to. °Avoid: °Foods with added sugar. °Fried foods. °Foods with trans fat or partially hydrogenated oils. This includes some margarines and baked goods. °If you drink alcohol: °Limit how much you have to: °0-1 drink a day for women who are not pregnant. °0-2 drinks a day for men. °Know how much alcohol is in a drink. In the [place] one drink equals one 12 oz bottle of beer (355 mL), one 5 oz glass of wine (148 mL), or one 1½ oz glass of hard liquor (44 mL). °Reading food labels °Check food labels for: °Trans fats. °Partially hydrogenated oils. °Saturated fat (g) in each serving. °Cholesterol (mg) in each serving. °Fiber (g) in each serving. °Choose foods with healthy fats, such as: °Monounsaturated fats and polyunsaturated fats. These include olive and canola oil, flaxseeds, walnuts, almonds, and seeds. °Omega-3 fats. These are found in certain fish, flaxseed oil, and ground flaxseeds. °Choose grain products that have whole grains. Look for the word "whole" as the first word in the ingredient list. °Cooking °Cook foods using low-fat methods. These include baking, boiling, grilling, and broiling. °Eat more home-cooked foods. Eat at restaurants and buffets less often. Eat less fast food. °Avoid cooking using saturated fats, such as butter, cream, palm oil, palm kernel oil, and coconut oil. °Meal planning ° °At meals, divide your plate into four equal parts: °Fill one-half of your plate with vegetables, green salads, and  fruit. °Fill one-fourth of your plate with whole grains. °Fill one-fourth of your plate with low-fat (lean) protein foods. °Eat fish that is high in omega-3 fats at least two times a week. This includes mackerel, tuna, sardines, and salmon. °Eat foods that are high in fiber, such as whole grains, beans, apples, pears, berries, broccoli, carrots, peas, and barley. °What foods should I eat? °Fruits °All fresh, canned (in natural juice), or frozen fruits. °Vegetables °Fresh or frozen vegetables (raw, steamed, roasted, or grilled). Green salads. °Grains °Whole grains, such as whole wheat or whole grain breads, crackers, cereals, and pasta. Unsweetened oatmeal, bulgur, barley, quinoa, or brown rice. Corn or whole wheat flour tortillas. °Meats and other protein foods °Ground beef (85% or leaner), grass-fed beef, or beef trimmed of fat. Skinless chicken or turkey. Ground chicken or turkey. Pork trimmed of fat. All fish and seafood. Egg whites. Dried beans, peas, or lentils. Unsalted nuts or seeds. Unsalted canned beans. Nut butters without added sugar or oil. °Dairy °Low-fat or nonfat dairy products, such as skim or 1% milk, 2% or reduced-fat cheeses, low-fat and fat-free ricotta or cottage cheese, or plain low-fat and nonfat yogurt. °Fats and oils °Tub margarine without trans fats. Light or reduced-fat mayonnaise and salad dressings. Avocado. Olive, canola, sesame, or safflower oils. °The items listed above may not be a complete list of foods and beverages you can eat. Contact a dietitian for more information. °What foods should I avoid? °Fruits °Canned fruit in heavy syrup. Fruit in cream or butter sauce. Fried fruit. °Vegetables °Vegetables cooked in cheese, cream, or butter sauce. Fried vegetables. °Grains °White bread. White pasta. White rice. Cornbread. Bagels, pastries, and croissants. Crackers and snack foods that contain trans fat and hydrogenated oils. °Meats and other protein foods °Fatty cuts of meat. Ribs,  chicken wings, bacon, sausage, bologna, salami, chitterlings, fatback, hot dogs, bratwurst, and packaged lunch meats. Liver and organ meats. Whole eggs and egg yolks. Chicken and turkey with skin. Fried meat. °  Dairy °Whole or 2% milk, cream, half-and-half, and cream cheese. Whole milk cheeses. Whole-fat or sweetened yogurt. Full-fat cheeses. Nondairy creamers and whipped toppings. Processed cheese, cheese spreads, and cheese curds. °Fats and oils °Butter, stick margarine, lard, shortening, ghee, or bacon fat. Coconut, palm kernel, and palm oils. °Beverages °Alcohol. Sugar-sweetened drinks such as sodas, lemonade, and fruit drinks. °Sweets and desserts °Corn syrup, sugars, honey, and molasses. Candy. Jam and jelly. Syrup. Sweetened cereals. Cookies, pies, cakes, donuts, muffins, and ice cream. °The items listed above may not be a complete list of foods and beverages you should avoid. Contact a dietitian for more information. °Summary °Choosing the right foods helps keep your fat and cholesterol at normal levels. This can keep you from getting certain diseases. °At meals, fill one-half of your plate with vegetables, green salads, and fruits. °Eat high fiber foods, like whole grains, beans, apples, pears, berries, carrots, peas, and barley. °Limit added sugar, saturated fats, alcohol, and fried foods. °This information is not intended to replace advice given to you by your health care provider. Make sure you discuss any questions you have with your health care provider. °Document Revised: 03/09/2021 Document Reviewed: 03/09/2021 °Elsevier Patient Education © 2022 [employer] ° °

## 2021-11-15 ENCOUNTER — Encounter: Payer: Self-pay | Admitting: Nurse Practitioner

## 2021-11-15 ENCOUNTER — Ambulatory Visit (INDEPENDENT_AMBULATORY_CARE_PROVIDER_SITE_OTHER): Payer: BC Managed Care – PPO | Admitting: Nurse Practitioner

## 2021-11-15 ENCOUNTER — Other Ambulatory Visit: Payer: Self-pay

## 2021-11-15 VITALS — BP 108/64 | HR 74 | Temp 98.4°F | Ht 66.0 in | Wt 217.4 lb

## 2021-11-15 DIAGNOSIS — Z01818 Encounter for other preprocedural examination: Secondary | ICD-10-CM | POA: Diagnosis not present

## 2021-11-15 DIAGNOSIS — M1712 Unilateral primary osteoarthritis, left knee: Secondary | ICD-10-CM

## 2021-11-15 DIAGNOSIS — F418 Other specified anxiety disorders: Secondary | ICD-10-CM

## 2021-11-15 DIAGNOSIS — Z6835 Body mass index (BMI) 35.0-35.9, adult: Secondary | ICD-10-CM | POA: Diagnosis not present

## 2021-11-15 LAB — POCT GLYCOSYLATED HEMOGLOBIN (HGB A1C): Hemoglobin A1C: 5.3 % (ref 4.0–5.6)

## 2021-11-15 MED ORDER — FLUOXETINE HCL 10 MG PO TABS: 10.0000 mg | ORAL_TABLET | Freq: Every day | ORAL | 2 refills | Status: DC

## 2021-11-15 NOTE — Patient Instructions (Signed)
Fat and Cholesterol Restricted Eating Plan Getting too much fat and cholesterol in your diet may cause health problems. Choosing the right foods helps keep your fat and cholesterol at normal levels. This can keep you from getting certain diseases. Your doctor may recommend an eating plan that includes: Total fat: ______% or less of total calories a day. This is ______g of fat a day. Saturated fat: ______% or less of total calories a day. This is ______g of saturated fat a day. Cholesterol: less than _________mg a day. Fiber: ______g a day. What are tips for following this plan? General tips Work with your doctor to lose weight if you need to. Avoid: Foods with added sugar. Fried foods. Foods with trans fat or partially hydrogenated oils. This includes some margarines and baked goods. If you drink alcohol: Limit how much you have to: 0-1 drink a day for women who are not pregnant. 0-2 drinks a day for men. Know how much alcohol is in a drink. In the [place] one drink equals one 12 oz bottle of beer (355 mL), one 5 oz glass of wine (148 mL), or one 1 oz glass of hard liquor (44 mL). Reading food labels Check food labels for: Trans fats. Partially hydrogenated oils. Saturated fat (g) in each serving. Cholesterol (mg) in each serving. Fiber (g) in each serving. Choose foods with healthy fats, such as: Monounsaturated fats and polyunsaturated fats. These include olive and canola oil, flaxseeds, walnuts, almonds, and seeds. Omega-3 fats. These are found in certain fish, flaxseed oil, and ground flaxseeds. Choose grain products that have whole grains. Look for the word "whole" as the first word in the ingredient list. Cooking Cook foods using low-fat methods. These include baking, boiling, grilling, and broiling. Eat more home-cooked foods. Eat at restaurants and buffets less often. Eat less fast food. Avoid cooking using saturated fats, such as butter, cream, palm oil, palm kernel oil, and  coconut oil. Meal planning  At meals, divide your plate into four equal parts: Fill one-half of your plate with vegetables, green salads, and fruit. Fill one-fourth of your plate with whole grains. Fill one-fourth of your plate with low-fat (lean) protein foods. Eat fish that is high in omega-3 fats at least two times a week. This includes mackerel, tuna, sardines, and salmon. Eat foods that are high in fiber, such as whole grains, beans, apples, pears, berries, broccoli, carrots, peas, and barley. What foods should I eat? Fruits All fresh, canned (in natural juice), or frozen fruits. Vegetables Fresh or frozen vegetables (raw, steamed, roasted, or grilled). Green salads. Grains Whole grains, such as whole wheat or whole grain breads, crackers, cereals, and pasta. Unsweetened oatmeal, bulgur, barley, quinoa, or brown rice. Corn or whole wheat flour tortillas. Meats and other protein foods Ground beef (85% or leaner), grass-fed beef, or beef trimmed of fat. Skinless chicken or turkey. Ground chicken or turkey. Pork trimmed of fat. All fish and seafood. Egg whites. Dried beans, peas, or lentils. Unsalted nuts or seeds. Unsalted canned beans. Nut butters without added sugar or oil. Dairy Low-fat or nonfat dairy products, such as skim or 1% milk, 2% or reduced-fat cheeses, low-fat and fat-free ricotta or cottage cheese, or plain low-fat and nonfat yogurt. Fats and oils Tub margarine without trans fats. Light or reduced-fat mayonnaise and salad dressings. Avocado. Olive, canola, sesame, or safflower oils. The items listed above may not be a complete list of foods and beverages you can eat. Contact a dietitian for more information. What foods   should I avoid? Fruits Canned fruit in heavy syrup. Fruit in cream or butter sauce. Fried fruit. Vegetables Vegetables cooked in cheese, cream, or butter sauce. Fried vegetables. Grains White bread. White pasta. White rice. Cornbread. Bagels, pastries,  and croissants. Crackers and snack foods that contain trans fat and hydrogenated oils. Meats and other protein foods Fatty cuts of meat. Ribs, chicken wings, bacon, sausage, bologna, salami, chitterlings, fatback, hot dogs, bratwurst, and packaged lunch meats. Liver and organ meats. Whole eggs and egg yolks. Chicken and turkey with skin. Fried meat. Dairy Whole or 2% milk, cream, half-and-half, and cream cheese. Whole milk cheeses. Whole-fat or sweetened yogurt. Full-fat cheeses. Nondairy creamers and whipped toppings. Processed cheese, cheese spreads, and cheese curds. Fats and oils Butter, stick margarine, lard, shortening, ghee, or bacon fat. Coconut, palm kernel, and palm oils. Beverages Alcohol. Sugar-sweetened drinks such as sodas, lemonade, and fruit drinks. Sweets and desserts Corn syrup, sugars, honey, and molasses. Candy. Jam and jelly. Syrup. Sweetened cereals. Cookies, pies, cakes, donuts, muffins, and ice cream. The items listed above may not be a complete list of foods and beverages you should avoid. Contact a dietitian for more information. Summary Choosing the right foods helps keep your fat and cholesterol at normal levels. This can keep you from getting certain diseases. At meals, fill one-half of your plate with vegetables, green salads, and fruits. Eat high fiber foods, like whole grains, beans, apples, pears, berries, carrots, peas, and barley. Limit added sugar, saturated fats, alcohol, and fried foods. This information is not intended to replace advice given to you by your health care provider. Make sure you discuss any questions you have with your health care provider. Document Revised: 03/09/2021 Document Reviewed: 03/09/2021 Elsevier Patient Education  2022 Elsevier Inc.  

## 2021-11-15 NOTE — Progress Notes (Signed)
Established Patient Office Visit  Subjective:  Patient ID: [name] [name], female    DOB: 1971-08-31  Age: 51 y.o. MRN: [id number]  CC:  Chief Complaint  Patient presents with   surgical clearance    HPI [name] [name] presents for surgical clearance. 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 She has already had knee replacement on the right side.  The patient is concerned about depression around the time of surgery. She states that the last time she had surgery she got very anxious and was very "weepy" the first few weeks after. She states that when she gets anxiety, she gets pain and tightness in her chest and throat. She states that her clonazepam does help, but feels like she may be oh something everyday to keep her mood more stable.  She has no other concerns or complaints at this time.   [name] woke up during knee surgery in 2017   Constipation    uses suppositories prn   Family history of breast cancer    10/11 maternal aunts with breast cancer. Patient has undergone prophylactic b/l mastectomy   Fatigue    Juice plus and testosterone helps    Past Surgical History:  Procedure Laterality Date   BREAST RECONSTRUCTION Bilateral 02/05/2018   Performed: BILATERAL Skin SPARING MASTECTOMIES (Bilateral Breast)   BREAST RECONSTRUCTION WITH PLACEMENT OF TISSUE EXPANDER AND FLEX HD (ACELLULAR HYDRATED DERMIS) Bilateral 02/05/2018   Procedure: BILATERAL BREAST RECONSTRUCTION WITH PLACEMENT OF TISSUE EXPANDER AND FLEX HD (ACELLULAR HYDRATED DERMIS);  Surgeon: Crissie Reese, MD;  Location: [place];  Service: Plastics;  Laterality: Bilateral;   COLONOSCOPY     NIPPLE SPARING MASTECTOMY Bilateral 02/05/2018   Procedure: BILATERAL Skin SPARING MASTECTOMIES;  Surgeon: Autumn Messing III, MD;  Location: [place];  Service: General;  Laterality: Bilateral;    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   torn meniscus  2015   left leg/ right knee in 2016   WISDOM TOOTH EXTRACTION     in mid 20's    Family History  Problem Relation Age of Onset   Colon cancer Maternal Grandmother    Breast cancer Mother        10/11 maternal aunts had breast cancer   Heart disease Father        MI at age 71   Stroke Father    Colon cancer Maternal Uncle    Heart disease Paternal Uncle        MI at age 36   Esophageal cancer Neg Hx    Stomach cancer Neg Hx    Rectal cancer Neg Hx     Social History   Socioeconomic History   Marital status: Married    Spouse name: Not on file   Number of children: 2   Years of education: Not on file   Highest education level: Not on file  Occupational History   Not on file  Tobacco Use   Smoking status: Never   Smokeless tobacco: Never  Vaping Use   Vaping Use: Never used  Substance and Sexual Activity   Alcohol use: Yes    Comment: rarely   Drug use: No   Sexual activity: Yes    Comment: husband had vasectomy  Other Topics Concern   Not on file  Social History Narrative   Not  on file   Social Determinants of Health   Financial Resource Strain: Not on file  Food Insecurity: Not on file  Transportation Needs: Not on file  Physical Activity: Not on file  Stress: Not on file  Social Connections: Not on file  Intimate Partner Violence: Not on file    Outpatient Medications Prior to Visit  Medication Sig Dispense Refill   acetaminophen (TYLENOL) 500 MG tablet Take 500 mg by mouth every 6 (six) hours as needed.     Cholecalciferol (VITAMIN D3) 3000 units TABS Take 3,000 Units by mouth at bedtime.     clonazePAM (KLONOPIN) 1 MG tablet Take 0.5 tablets by mouth at bedtime.     meloxicam (MOBIC) 15 MG tablet Take 15 mg by mouth daily.     cimetidine (TAGAMET) 200 MG tablet Take 200 mg by mouth as needed. (Patient not taking: Reported on 11/13/2021)     phenylephrine (NEO-SYNEPHRINE) 1 % nasal spray  Place 1 drop into both nostrils every 6 (six) hours as needed for congestion. (Patient not taking: Reported on 11/13/2021)     No facility-administered medications prior to visit.    Allergies  Allergen Reactions   Oxycodone Nausea And Vomiting and Other (See Comments)    Causes "BAD" feeling, nausea, paranoid    ROS Review of Systems  Constitutional:  Positive for fatigue. Negative for activity change, appetite change, chills and fever.  HENT:  Negative for congestion, postnasal drip, rhinorrhea, sinus pressure, sinus pain, sneezing and sore throat.   Eyes: Negative.   Respiratory:  Negative for cough, chest tightness, shortness of breath and wheezing.   Cardiovascular:  Negative for chest pain and palpitations.  Gastrointestinal:  Negative for abdominal pain, constipation, diarrhea, nausea and vomiting.  Endocrine: Negative for cold intolerance, heat intolerance, polydipsia and polyuria.  Genitourinary:  Negative for dyspareunia, dysuria, flank pain, frequency and urgency.  Musculoskeletal:  Positive for arthralgias, gait problem and joint swelling. Negative for back pain and myalgias.       Left knee pain. Getting ready to have total left knee replacement.   Skin:  Negative for rash.  Allergic/Immunologic: Negative for environmental allergies.  Neurological:  Negative for dizziness, weakness and headaches.  Hematological:  Negative for adenopathy.  Psychiatric/Behavioral:  The patient is nervous/anxious.      Objective:    Physical Exam Vitals and nursing note reviewed.  Constitutional:      Appearance: Normal appearance. She is well-developed. She is obese.  HENT:     Head: Normocephalic and atraumatic.     Nose: Nose normal.     Mouth/Throat:     Mouth: Mucous membranes are moist.     Pharynx: Oropharynx is clear.  Eyes:     Extraocular Movements: Extraocular movements intact.     Conjunctiva/sclera: Conjunctivae normal.     Pupils: Pupils are equal, round, and  reactive to light.  Cardiovascular:     Rate and Rhythm: Normal rate and regular rhythm.     Pulses: Normal pulses.     Heart sounds: Normal heart sounds.     Comments: Normal ECG during today's visit  Pulmonary:     Effort: Pulmonary effort is normal.     Breath sounds: Normal breath sounds.  Abdominal:     General: Bowel sounds are normal. There is no distension.     Palpations: Abdomen is soft. There is no mass.     Tenderness: There is no abdominal tenderness. There is no guarding or rebound.  Hernia: No hernia is present.  Musculoskeletal:        General: Normal range of motion.     Cervical back: Normal range of motion and neck supple.  Lymphadenopathy:     Cervical: No cervical adenopathy.  Skin:    General: Skin is warm and dry.     Capillary Refill: Capillary refill takes less than 2 seconds.  Neurological:     General: No focal deficit present.     Mental Status: She is alert and oriented to person, place, and time.  Psychiatric:        Attention and Perception: Attention and perception normal.        Mood and Affect: Affect normal. Mood is anxious.        Speech: Speech normal.        Behavior: Behavior normal. Behavior is cooperative.        Thought Content: Thought content normal.        Cognition and Memory: Cognition and memory normal.        Judgment: Judgment normal.    Today's Vitals   11/15/21 1036  BP: 108/64  Pulse: 74  Temp: 98.4 F (36.9 C)  SpO2: 98%  Weight: 217 lb 6.4 oz (98.6 kg)  Height: 5\' 6"  (1.676 m)   Body mass index is 35.09 kg/m.   Wt Readings from Last 3 Encounters:  11/15/21 217 lb 6.4 oz (98.6 kg)  11/13/21 218 lb 14.4 oz (99.3 kg)  09/07/20 230 lb (104.3 kg)     Health Maintenance Due  Topic Date Due   HIV Screening  Never done   Hepatitis C Screening  Never done   TETANUS/TDAP  Never done   PAP SMEAR-Modifier  08/01/2018   MAMMOGRAM  08/25/2020   Zoster Vaccines- Shingrix (1 of 2) Never done    There are no  preventive care reminders to display for this patient.  No results found for: TSH Lab Results  Component Value Date   WBC 7.5 01/28/2018   HGB 12.8 01/28/2018   HCT 39.3 01/28/2018   MCV 86.2 01/28/2018   PLT 414 (H) 01/28/2018   Lab Results  Component Value Date   NA 136 01/28/2018   K 3.7 01/28/2018   CO2 24 01/28/2018   GLUCOSE 98 01/28/2018   BUN 15 01/28/2018   CREATININE 0.87 01/28/2018   CALCIUM 9.3 01/28/2018   ANIONGAP 7 01/28/2018      Assessment & Plan:  1. Preprocedural examination Patient is scheduled to have total left knee replacement on 11/30/2021.  She had a normal EKG today.  Her hemoglobin A1c was 5.3.  She is cleared to have surgery with minimal risk.  Will complete preoperative assessment form and return to [place] when complete. - EKG 12-Lead - POCT glycosylated hemoglobin (Hb A1C)  2. Primary osteoarthritis of left knee Patient is scheduled for total knee replacement on 11/30/2021.  3. Body mass index (BMI) of 35.0-35.9 in adult Discussed lowering calorie intake to 1500 calories per day and incorporating exercise into daily routine to help lose weight. Will monitor.   4. Situational anxiety Will start Prozac 10 mg.  We will continue this dose of medication through surgery.  May have to adjust dosing in the immediate postoperative period.  Patient to call and update with symptoms.  We will see back in 6 weeks to discuss tapering off medication at that time. - FLUoxetine (PROZAC) 10 MG tablet; Take 1 tablet (10 mg total) by mouth daily.  Dispense:  30 tablet; Refill: 2   Problem List Items Addressed This Visit       Musculoskeletal and Integument   Primary osteoarthritis of left knee     Other   Body mass index (BMI) of 35.0-35.9 in adult   Situational anxiety   Relevant Medications   FLUoxetine (PROZAC) 10 MG tablet   Other Visit Diagnoses     Preprocedural examination    -  Primary   Relevant Orders   EKG 12-Lead   POCT  glycosylated hemoglobin (Hb A1C)       Meds ordered this encounter  Medications   FLUoxetine (PROZAC) 10 MG tablet    Sig: Take 1 tablet (10 mg total) by mouth daily.    Dispense:  30 tablet    Refill:  2    Order Specific Question:   Supervising Provider    Answer:   Beatrice Lecher D [[id number]]    Follow-up: Return in about 6 weeks (around 12/27/2021) for mood.    Ronnell Freshwater, NP  This note was dictated using Systems analyst. Rapid proofreading was performed to expedite the delivery of the information. Despite proofreading, phonetic errors will occur which are common with this voice recognition software. Please take this into consideration. If there are any concerns, please contact our office.

## 2021-11-23 ENCOUNTER — Encounter: Payer: Self-pay | Admitting: Nurse Practitioner

## 2022-02-04 ENCOUNTER — Ambulatory Visit: Payer: BC Managed Care – PPO | Admitting: Nurse Practitioner

## 2022-02-07 ENCOUNTER — Ambulatory Visit: Payer: BC Managed Care – PPO | Admitting: Nurse Practitioner

## 2022-02-07 ENCOUNTER — Encounter: Payer: Self-pay | Admitting: Nurse Practitioner

## 2022-02-07 VITALS — BP 106/67 | HR 89 | Temp 98.6°F | Ht 66.0 in | Wt 230.0 lb

## 2022-02-07 DIAGNOSIS — Z6837 Body mass index (BMI) 37.0-37.9, adult: Secondary | ICD-10-CM | POA: Diagnosis not present

## 2022-02-07 DIAGNOSIS — G252 Other specified forms of tremor: Secondary | ICD-10-CM

## 2022-02-07 DIAGNOSIS — G2581 Restless legs syndrome: Secondary | ICD-10-CM | POA: Diagnosis not present

## 2022-02-07 DIAGNOSIS — F418 Other specified anxiety disorders: Secondary | ICD-10-CM

## 2022-02-07 MED ORDER — FLUOXETINE HCL 10 MG PO TABS: 10.0000 mg | ORAL_TABLET | Freq: Every day | ORAL | 2 refills | Status: DC

## 2022-02-07 MED ORDER — ROPINIROLE HCL 0.5 MG PO TABS: ORAL_TABLET | ORAL | 1 refills | Status: DC

## 2022-02-07 NOTE — Progress Notes (Signed)
Established patient visit ? ? ?Patient: [name] [name]   DOB: 1970-12-09   51 y.o. Female  MRN: [id number] ?Visit Date: 02/07/2022 ? ? ?Chief Complaint  ?Patient presents with  ? Leg Pain  ? ?Subjective  ?  ?HPI  ?The patient is here for acute visit.  ?-has developed jerking and twitching of the right leg after having left total knee replacement surgery. She feels like twitching and jerking is also effecting her left arm. This is happening mostly in the evening and does, at times, prevent her from sleeping well. Was not a problem prior to her left knee replacement surgery  ?-has had surgery on the right knee In the past. Not having as good ROM in the right knee as the left. States she was told they did some manipulation of the right knee during left knee surgery. She is unsure what was actually done.  ?-depression continues to be well managed on low dose fluoxetine. She states that she needs a refill for this today.  ? ? ?Medications: ?Outpatient Medications Prior to Visit  ?Medication Sig  ? acetaminophen (TYLENOL) 500 MG tablet Take 500 mg by mouth every 6 (six) hours as needed.  ? Cholecalciferol (VITAMIN D3) 3000 units TABS Take 3,000 Units by mouth at bedtime.  ? [DISCONTINUED] FLUoxetine (PROZAC) 10 MG tablet Take 1 tablet (10 mg total) by mouth daily.  ? [DISCONTINUED] cimetidine (TAGAMET) 200 MG tablet Take 200 mg by mouth as needed. (Patient not taking: Reported on 11/13/2021)  ? [DISCONTINUED] clonazePAM (KLONOPIN) 1 MG tablet Take 0.5 tablets by mouth at bedtime.  ? [DISCONTINUED] meloxicam (MOBIC) 15 MG tablet Take 15 mg by mouth daily.  ? [DISCONTINUED] phenylephrine (NEO-SYNEPHRINE) 1 % nasal spray Place 1 drop into both nostrils every 6 (six) hours as needed for congestion. (Patient not taking: Reported on 11/13/2021)  ? ?No facility-administered medications prior to visit.  ? ? ?Review of Systems  ?Constitutional:  Positive for activity change and fatigue. Negative for appetite change, chills and fever.   ?HENT:  Negative for congestion, postnasal drip, rhinorrhea, sinus pressure, sinus pain, sneezing and sore throat.   ?Eyes: Negative.   ?Respiratory:  Negative for cough, chest tightness, shortness of breath and wheezing.   ?Cardiovascular:  Negative for chest pain and palpitations.  ?Gastrointestinal:  Negative for abdominal pain, constipation, diarrhea, nausea and vomiting.  ?Endocrine: Negative for cold intolerance, heat intolerance, polydipsia and polyuria.  ?Genitourinary:  Negative for dyspareunia, dysuria, flank pain, frequency and urgency.  ?Musculoskeletal:  Negative for arthralgias, back pain and myalgias.  ?     Right leg jerking wit tremor, mostly at night. Patient states she has also noted this some in left arm. Has been most obvious since having left knee replacement surgery.   ?Skin:  Negative for rash.  ?Allergic/Immunologic: Negative for environmental allergies.  ?Neurological:  Negative for dizziness, weakness and headaches.  ?Hematological:  Negative for adenopathy.  ?Psychiatric/Behavioral:  Positive for dysphoric mood. The patient is nervous/anxious.   ?     Well managed on current dose fluoxetine.   ? ? ? ? Objective  ?  ? ?Today's Vitals  ? 02/07/22 1404  ?BP: 106/67  ?Pulse: 89  ?Temp: 98.6 ?F (37 ?C)  ?SpO2: 96%  ?Weight: 230 lb (104.3 kg)  ?Height: '5\' 6"'$  (1.676 m)  ? ?Body mass index is 37.12 kg/m?.  ? ?BP Readings from Last 3 Encounters:  ?02/07/22 106/67  ?11/15/21 108/64  ?11/13/21 102/61  ?  ?Wt Readings from Last 3 Encounters:  ?  02/07/22 230 lb (104.3 kg)  ?11/15/21 217 lb 6.4 oz (98.6 kg)  ?11/13/21 218 lb 14.4 oz (99.3 kg)  ?  ?Physical Exam ?Vitals and nursing note reviewed.  ?Constitutional:   ?   Appearance: Normal appearance. She is well-developed.  ?HENT:  ?   Head: Normocephalic and atraumatic.  ?Eyes:  ?   Pupils: Pupils are equal, round, and reactive to light.  ?Cardiovascular:  ?   Rate and Rhythm: Normal rate and regular rhythm.  ?   Pulses: Normal pulses.  ?   Heart  sounds: Normal heart sounds.  ?Pulmonary:  ?   Effort: Pulmonary effort is normal.  ?   Breath sounds: Normal breath sounds.  ?Abdominal:  ?   Palpations: Abdomen is soft.  ?Musculoskeletal:     ?   General: Normal range of motion.  ?   Cervical back: Normal range of motion and neck supple.  ?Lymphadenopathy:  ?   Cervical: No cervical adenopathy.  ?Skin: ?   General: Skin is warm and dry.  ?   Capillary Refill: Capillary refill takes less than 2 seconds.  ?Neurological:  ?   General: No focal deficit present.  ?   Mental Status: She is alert and oriented to person, place, and time.  ?   Comments: Very slight tremor present of left hand.   ?Psychiatric:     ?   Mood and Affect: Mood normal.     ?   Behavior: Behavior normal.     ?   Thought Content: Thought content normal.     ?   Judgment: Judgment normal.  ? ? Assessment & Plan  ?  ?1. Restless leg syndrome ?Mostly present in right leg since having total knee replacement of left knee. Trial Requip 0.'5mg'$  tablets. Will have her start with 1 tablet and increase to 2 tablets as needed and as indicated.  ?- rOPINIRole (REQUIP) 0.5 MG tablet; Take 1 to 2 tablets po QHS for RLS  Dispense: 60 tablet; Refill: 1 ? ?2. Fine tremor ?Present since prior to surgery and very mild. Requip started to help with RLS. May help fine tremor also. Will reassess in two weeks.  ? ?3. Situational anxiety ?Stable. Continue fluoxetine '10mg'$  daily.  ?- FLUoxetine (PROZAC) 10 MG tablet; Take 1 tablet (10 mg total) by mouth daily.  Dispense: 30 tablet; Refill: 2 ? ?4. Body mass index (BMI) of 37.0-37.9 in adult ?Discussed lowering calorie intake to 1500 calories per day and incorporating exercise into daily routine to help lose weight.  ? ?  ?Problem List Items Addressed This Visit   ? ?  ? Other  ? Body mass index (BMI) of 37.0-37.9 in adult  ? Situational anxiety  ? Relevant Medications  ? FLUoxetine (PROZAC) 10 MG tablet  ? Restless leg syndrome - Primary  ? Relevant Medications  ? rOPINIRole  (REQUIP) 0.5 MG tablet  ? Fine tremor  ?  ? ?Return in about 2 weeks (around 02/21/2022) for rls - may need ECG. having chest discomfort .  ?   ? ? ? ? ?Ronnell Freshwater, NP  ?[facility] ?[phone] (phone) ?[phone] (fax) ? ? Medical Group  ?

## 2022-02-11 DIAGNOSIS — G252 Other specified forms of tremor: Secondary | ICD-10-CM | POA: Insufficient documentation

## 2022-02-11 DIAGNOSIS — G2581 Restless legs syndrome: Secondary | ICD-10-CM | POA: Insufficient documentation

## 2022-02-12 ENCOUNTER — Ambulatory Visit: Payer: BC Managed Care – PPO | Admitting: Podiatry

## 2022-02-12 DIAGNOSIS — B351 Tinea unguium: Secondary | ICD-10-CM

## 2022-02-12 MED ORDER — CEPHALEXIN 500 MG PO CAPS: 500.0000 mg | ORAL_CAPSULE | Freq: Three times a day (TID) | ORAL | 0 refills | Status: DC

## 2022-02-12 NOTE — Patient Instructions (Signed)

## 2022-02-15 NOTE — Progress Notes (Signed)
Subjective:  ? ?Patient ID: [name] [name], female   DOB: 51 y.o.   MRN: [id number]  ? ?HPI ?51 year old female presents the office today for concerns of a nail issue on her right big toe, medial aspect.  She has noticed some tenderness and soreness.  She is not seeing any drainage or pus coming from the area.  No recent treatment.  She has been pedicures.  No other concerns. ? ? ?Review of Systems  ?All other systems reviewed and are negative. ? ?Past Medical History:  ?Diagnosis Date  ? Complication of anesthesia   ? Pt woke up during knee surgery in 2017  ? Constipation   ? uses suppositories prn  ? Family history of breast cancer   ? 10/11 maternal aunts with breast cancer. Patient has undergone prophylactic b/l mastectomy  ? Fatigue   ? Juice plus and testosterone helps  ? ? ?Past Surgical History:  ?Procedure Laterality Date  ? BREAST RECONSTRUCTION Bilateral 02/05/2018  ? Performed: BILATERAL Skin SPARING MASTECTOMIES (Bilateral Breast)  ? BREAST RECONSTRUCTION WITH PLACEMENT OF TISSUE EXPANDER AND FLEX HD (ACELLULAR HYDRATED DERMIS) Bilateral 02/05/2018  ? Procedure: BILATERAL BREAST RECONSTRUCTION WITH PLACEMENT OF TISSUE EXPANDER AND FLEX HD (ACELLULAR HYDRATED DERMIS);  Surgeon: Crissie Reese, MD;  Location: [facility];  Service: Plastics;  Laterality: Bilateral;  ? COLONOSCOPY    ? NIPPLE SPARING MASTECTOMY Bilateral 02/05/2018  ? Procedure: BILATERAL Skin SPARING MASTECTOMIES;  Surgeon: Jovita Kussmaul, MD;  Location: [place];  Service: General;  Laterality: Bilateral;  ? REPLACEMENT TOTAL KNEE  2017  ? right knee  ? torn meniscus  2015  ? left leg/ right knee in 2016  ? WISDOM TOOTH EXTRACTION    ? in mid 20's  ? ? ? ?Current Outpatient Medications:  ?  cephALEXin (KEFLEX) 500 MG capsule, Take 1 capsule (500 mg total) by mouth 3 (three) times daily., Disp: 21 capsule, Rfl: 0 ?  acetaminophen (TYLENOL) 500 MG tablet, Take 500 mg by mouth every 6 (six) hours as needed., Disp: , Rfl:  ?  Cholecalciferol (VITAMIN D3)  3000 units TABS, Take 3,000 Units by mouth at bedtime., Disp: , Rfl:  ?  FLUoxetine (PROZAC) 10 MG tablet, Take 1 tablet (10 mg total) by mouth daily., Disp: 30 tablet, Rfl: 2 ?  rOPINIRole (REQUIP) 0.5 MG tablet, Take 1 to 2 tablets po QHS for RLS, Disp: 60 tablet, Rfl: 1 ? ?Allergies  ?Allergen Reactions  ? Oxycodone Nausea And Vomiting and Other (See Comments)  ?  Causes "BAD" feeling, nausea, paranoid  ? ? ? ? ? ?   ?Objective:  ?Physical Exam  ?General: AAO x3, NAD ? ?Dermatological: There is no significant incurvation of the distal portion of the right medial hallux nail border but there is localized edema and tenderness on the nail border.  The nail is somewhat dystrophic with yellow discoloration.  There was some dry, callus tissue which is able to debride today.  There is no drainage or pus.  No fluctuance or crepitation. ? ?Vascular: Dorsalis Pedis artery and Posterior Tibial artery pedal pulses are 2/4 bilateral with immedate capillary fill time. There is no pain with calf compression, swelling, warmth, erythema.  ? ?Neruologic: Grossly intact via light touch bilateral.  ? ?Musculoskeletal: Tenderness on the medial nail border right hallux distally.  Muscular strength 5/5 in all groups tested bilateral. ? ?Gait: Unassisted, Nonantalgic.  ? ? ?   ?Assessment:  ? ?Right hallux toenail pain ? ?   ?Plan:  ?-Treatment  options discussed including all alternatives, risks, and complications ?-Etiology of symptoms were discussed ?-Physical extremity.  Some of the hyperkeratotic tissue as well the nail without any complications or bleeding.  Nail was sent for culture, pathology to [facility]. Recommended Epsom salt soaks.  Antibiotic ointment around the nail as well.  Prescribed cephalexin.  ?-Proceed with partial nail avulsion. ? ?Trula Slade DPM ? ?   ? ?

## 2022-02-18 ENCOUNTER — Encounter: Payer: Self-pay | Admitting: Podiatry

## 2022-03-01 ENCOUNTER — Other Ambulatory Visit: Payer: Self-pay | Admitting: Podiatry

## 2022-03-01 ENCOUNTER — Encounter: Payer: Self-pay | Admitting: Nurse Practitioner

## 2022-03-01 ENCOUNTER — Other Ambulatory Visit: Payer: Self-pay | Admitting: Nurse Practitioner

## 2022-03-01 ENCOUNTER — Telehealth: Payer: Self-pay | Admitting: Nurse Practitioner

## 2022-03-01 DIAGNOSIS — S0006XA Insect bite (nonvenomous) of scalp, initial encounter: Secondary | ICD-10-CM

## 2022-03-01 DIAGNOSIS — R21 Rash and other nonspecific skin eruption: Secondary | ICD-10-CM

## 2022-03-01 MED ORDER — TRIAMCINOLONE ACETONIDE 0.1 % EX OINT: 1.0000 "application " | TOPICAL_OINTMENT | Freq: Two times a day (BID) | CUTANEOUS | 2 refills | Status: DC

## 2022-03-01 MED ORDER — DOXYCYCLINE HYCLATE 100 MG PO TABS: 100.0000 mg | ORAL_TABLET | Freq: Two times a day (BID) | ORAL | 0 refills | Status: DC

## 2022-03-01 MED ORDER — EFINACONAZOLE 10 % EX SOLN: 1.0000 [drp] | Freq: Every day | CUTANEOUS | 11 refills | Status: DC

## 2022-03-01 NOTE — Progress Notes (Signed)
Sent doxycycline '100mg'$  twice daily for next 2 weeks. Also added triamcinolone 1% ointment. This can be placed on effected area twice daily as needed for itching and irritation. Both meds sent to [facility]  ?

## 2022-03-01 NOTE — Telephone Encounter (Signed)
Called pt LVM stating that her Rx's were sent to pharmacy ?

## 2022-03-01 NOTE — Telephone Encounter (Signed)
Please let the patient know that I have sent doxycycline '100mg'$  twice daily for next 2 weeks. Also added triamcinolone 1% ointment. This can be placed on effected area twice daily as needed for itching and irritation. Both meds sent to [facility]. Thanks so much.   -HB

## 2022-03-01 NOTE — Telephone Encounter (Signed)
Patient will be sending you an image on Mychart however the patient has a tick bite and it is getting bigger and there is a rash around it. Patient is asking if you will please send her something in for it? Please advise ?

## 2022-03-13 ENCOUNTER — Telehealth: Payer: Self-pay | Admitting: Podiatry

## 2022-03-13 ENCOUNTER — Other Ambulatory Visit: Payer: Self-pay | Admitting: Podiatry

## 2022-03-13 MED ORDER — CICLOPIROX 8 % EX SOLN: Freq: Every day | CUTANEOUS | 2 refills | Status: DC

## 2022-03-13 NOTE — Telephone Encounter (Signed)
Pt called and stated that the RX - Efinaconazole 10% soln that was called in to her pharmacy on 4/21, was denied by her insurance. The cost was $900; she wanted to know if another RX can be called in ASAP bc she really needs something for her toe. Please advise. ?

## 2022-03-22 ENCOUNTER — Encounter: Payer: Self-pay | Admitting: Nurse Practitioner

## 2022-03-22 ENCOUNTER — Ambulatory Visit (INDEPENDENT_AMBULATORY_CARE_PROVIDER_SITE_OTHER): Payer: BC Managed Care – PPO | Admitting: Nurse Practitioner

## 2022-03-22 VITALS — BP 110/71 | HR 71 | Temp 98.2°F | Ht 66.0 in | Wt 237.1 lb

## 2022-03-22 DIAGNOSIS — R600 Localized edema: Secondary | ICD-10-CM | POA: Diagnosis not present

## 2022-03-22 MED ORDER — FUROSEMIDE 20 MG PO TABS: 20.0000 mg | ORAL_TABLET | Freq: Every day | ORAL | 3 refills | Status: DC | PRN

## 2022-03-22 NOTE — Progress Notes (Signed)
Established patient visit   Patient: [name] [name]   DOB: 1971-03-08   51 y.o. Female  MRN: [id number] Visit Date: 03/22/2022  Chief Complaint  Patient presents with   Joint Swelling   Ankle Pain   Subjective    HPI  Patient having pain and swelling in her left ankle.  This is same side she had knee surgery on recently.  She states swelling is happening when feet are dependent or when she is sitting for long peers of time.  Also happening when standing for long periods of time.  She has full range of motion of her ankle she can point to flex her foot she can roll her ankle.  Swelling is more pronounced the medial aspect of the left ankle and on the dorsal surface of the left foot. She denies other concerns or complaints today. denies chest pain, chest pressure, or shortness of breath. She denies headaches or visual disturbances. She denies abdominal pain, nausea, vomiting, or changes in bowel or bladder habits.     Medications: Outpatient Medications Prior to Visit  Medication Sig   acetaminophen (TYLENOL) 500 MG tablet Take 500 mg by mouth every 6 (six) hours as needed.   Cholecalciferol (VITAMIN D3) 3000 units TABS Take 3,000 Units by mouth at bedtime.   ciclopirox (PENLAC) 8 % solution Apply topically at bedtime. Apply over nail and surrounding skin. Apply daily over previous coat. After seven (7) days, may remove with alcohol and continue cycle.   FLUoxetine (PROZAC) 10 MG tablet Take 1 tablet (10 mg total) by mouth daily.   rOPINIRole (REQUIP) 0.5 MG tablet Take 1 to 2 tablets po QHS for RLS   triamcinolone ointment (KENALOG) 0.1 % Apply 1 application. topically 2 (two) times daily.   [DISCONTINUED] cephALEXin (KEFLEX) 500 MG capsule Take 1 capsule (500 mg total) by mouth 3 (three) times daily.   [DISCONTINUED] doxycycline (VIBRA-TABS) 100 MG tablet Take 1 tablet (100 mg total) by mouth 2 (two) times daily.   No facility-administered medications prior to visit.    Review of Systems   Constitutional:  Positive for activity change. Negative for appetite change, chills, fatigue and fever.  HENT:  Negative for congestion, postnasal drip, rhinorrhea, sinus pressure, sinus pain, sneezing and sore throat.   Eyes: Negative.   Respiratory:  Negative for cough, chest tightness, shortness of breath and wheezing.   Cardiovascular:  Positive for leg swelling. Negative for chest pain.  Gastrointestinal:  Negative for abdominal pain, constipation, diarrhea, nausea and vomiting.  Endocrine: Negative for cold intolerance, heat intolerance, polydipsia and polyuria.  Genitourinary:  Negative for dyspareunia, dysuria, flank pain, frequency and urgency.  Musculoskeletal:  Negative for arthralgias, back pain and myalgias.  Skin:  Negative for rash.  Allergic/Immunologic: Negative for environmental allergies.  Neurological:  Negative for dizziness, weakness and headaches.  Hematological:  Negative for adenopathy.  Psychiatric/Behavioral:  The patient is not nervous/anxious.     Objective     Today's Vitals   03/22/22 1100  BP: 110/71  Pulse: 71  Temp: 98.2 F (36.8 C)  SpO2: 99%  Weight: 237 lb 1.9 oz (107.6 kg)   Body mass index is 38.27 kg/m.   Physical Exam Vitals and nursing note reviewed.  Constitutional:      Appearance: Normal appearance. She is well-developed.  HENT:     Head: Normocephalic and atraumatic.     Nose: Nose normal.     Mouth/Throat:     Mouth: Mucous membranes are moist.  Pharynx: Oropharynx is clear.  Eyes:     Conjunctiva/sclera: Conjunctivae normal.     Pupils: Pupils are equal, round, and reactive to light.  Cardiovascular:     Rate and Rhythm: Normal rate and regular rhythm.     Pulses: Normal pulses.     Heart sounds: Normal heart sounds.  Pulmonary:     Effort: Pulmonary effort is normal.     Breath sounds: Normal breath sounds.  Abdominal:     Palpations: Abdomen is soft.  Musculoskeletal:        General: Normal range of motion.      Cervical back: Normal range of motion and neck supple.     Left lower leg: Edema present.     Comments: There is mild, nonpitting edema in the left foot and ankle.  This is most prominent in the dorsal aspect of the left foot and medial aspect of the left ankle.  Not interfering with her movement.  She can flex and dorsiflex her foot without difficulty.  She can move all toes.  She can roll the ankle in all directions.  Lymphadenopathy:     Cervical: No cervical adenopathy.  Skin:    General: Skin is warm and dry.     Capillary Refill: Capillary refill takes less than 2 seconds.  Neurological:     General: No focal deficit present.     Mental Status: She is alert and oriented to person, place, and time.  Psychiatric:        Mood and Affect: Mood normal.        Behavior: Behavior normal.        Thought Content: Thought content normal.        Judgment: Judgment normal.     Assessment & Plan     1. Lower extremity edema Start furosemide 20 mg tablets daily as needed.  Advised her to rest and elevate her left leg when possible.  She should drink plenty of water.  Also recommend using compression sock to help venous efficiency  - furosemide (LASIX) 20 MG tablet; Take 1 tablet (20 mg total) by mouth daily as needed.  Dispense: 10 tablet; Refill: 3   Return for prn worsening or persistent symptoms.        Ronnell Freshwater, NP  [facility] at [facility] [phone] (phone) [phone] (fax)  [place]

## 2022-03-28 ENCOUNTER — Ambulatory Visit: Payer: BC Managed Care – PPO | Admitting: Nurse Practitioner

## 2022-04-09 ENCOUNTER — Telehealth: Payer: Self-pay | Admitting: Nurse Practitioner

## 2022-04-09 NOTE — Telephone Encounter (Signed)
Patient is asking does she need to take the fluid medication you prescribed for her every day? Please advise.

## 2022-04-09 NOTE — Telephone Encounter (Signed)
Called pt she is advised to take the Rx as needed

## 2022-04-14 NOTE — Progress Notes (Signed)
Established patient visit   Patient: [name] [name]   DOB: [birth year]   51 y.o. Female  MRN: [id number] Visit Date: 04/15/2022   Chief Complaint  Patient presents with   Leg Swelling   Weight Management Screening   Subjective    HPI  Patient presenting to discuss medication options to help with weight management.  -continues to have trouble with left ankle swelling. Had knee replacement surgery on left leg 11/30/2021. States  that the swelling is everyday thing now. Gets very tight. Feels very tight. Uncomfortable. After sleeping, swelling goes down. When swollen and tight, she states that she can feel pulsation in her left lower leg. It is slightly red.  -right right finger has cut she got over the weekend. It is oozing off and on. May have developed a blister from wearing new gloves.  -she also wants to discuss medications for weight management. She has had a four pound weight gain since her last visit with me and has gained 11 pounds over past three visits. Does not want to start on phentermine. She has been on this in the past and this caused her severe constipation.    Medications: Outpatient Medications Prior to Visit  Medication Sig   acetaminophen (TYLENOL) 500 MG tablet Take 500 mg by mouth every 6 (six) hours as needed.   Cholecalciferol (VITAMIN D3) 3000 units TABS Take 3,000 Units by mouth at bedtime.   ciclopirox (PENLAC) 8 % solution Apply topically at bedtime. Apply over nail and surrounding skin. Apply daily over previous coat. After seven (7) days, may remove with alcohol and continue cycle.   FLUoxetine (PROZAC) 10 MG tablet Take 1 tablet (10 mg total) by mouth daily.   furosemide (LASIX) 20 MG tablet Take 1 tablet (20 mg total) by mouth daily as needed.   rOPINIRole (REQUIP) 0.5 MG tablet Take 1 to 2 tablets po QHS for RLS (Patient not taking: Reported on 04/15/2022)   triamcinolone ointment (KENALOG) 0.1 % Apply 1 application. topically 2 (two) times daily. (Patient not  taking: Reported on 04/15/2022)   No facility-administered medications prior to visit.    Review of Systems  Constitutional:  Negative for activity change, appetite change, chills, fatigue and fever.       Weight gain 4 pounds since most recent visit.  HENT:  Negative for congestion, postnasal drip, rhinorrhea, sinus pressure, sinus pain, sneezing and sore throat.   Eyes: Negative.   Respiratory:  Negative for cough, chest tightness, shortness of breath and wheezing.   Cardiovascular:  Positive for leg swelling. Negative for chest pain and palpitations.       Left leg swelling.  Gastrointestinal:  Negative for abdominal pain, constipation, diarrhea, nausea and vomiting.  Endocrine: Negative for cold intolerance, heat intolerance, polydipsia and polyuria.  Genitourinary:  Negative for dyspareunia, dysuria, flank pain, frequency and urgency.  Musculoskeletal:  Negative for arthralgias, back pain and myalgias.  Skin:  Negative for rash.  Allergic/Immunologic: Negative for environmental allergies.  Neurological:  Negative for dizziness, weakness and headaches.  Hematological:  Negative for adenopathy.  Psychiatric/Behavioral:  The patient is not nervous/anxious.        Objective     Today's Vitals   04/15/22 1110  BP: 104/67  Pulse: 66  Temp: (!) 97.1 F (36.2 C)  SpO2: 98%  Weight: 241 lb 4.8 oz (109.5 kg)  Height: 5' 6.14" (1.68 m)   Body mass index is 38.78 kg/m.   BP Readings from Last 3 Encounters:  04/15/22 104/67  03/22/22 110/71  02/07/22 106/67    Wt Readings from Last 3 Encounters:  04/15/22 241 lb 4.8 oz (109.5 kg)  03/22/22 237 lb 1.9 oz (107.6 kg)  02/07/22 230 lb (104.3 kg)    Physical Exam Vitals and nursing note reviewed.  Constitutional:      Appearance: Normal appearance. She is well-developed.  HENT:     Head: Normocephalic and atraumatic.     Nose: Nose normal.     Mouth/Throat:     Mouth: Mucous membranes are moist.     Pharynx: Oropharynx  is clear.  Eyes:     Extraocular Movements: Extraocular movements intact.     Conjunctiva/sclera: Conjunctivae normal.     Pupils: Pupils are equal, round, and reactive to light.  Cardiovascular:     Rate and Rhythm: Normal rate and regular rhythm.     Pulses: Normal pulses.     Heart sounds: Normal heart sounds.  Pulmonary:     Effort: Pulmonary effort is normal.     Breath sounds: Normal breath sounds.  Abdominal:     Palpations: Abdomen is soft.  Musculoskeletal:        General: Normal range of motion.     Cervical back: Normal range of motion and neck supple.     Right lower leg: No edema.     Left lower leg: Edema present.  Lymphadenopathy:     Cervical: No cervical adenopathy.  Skin:    General: Skin is warm and dry.     Capillary Refill: Capillary refill takes less than 2 seconds.  Neurological:     General: No focal deficit present.     Mental Status: She is alert and oriented to person, place, and time.  Psychiatric:        Mood and Affect: Mood normal.        Behavior: Behavior normal.        Thought Content: Thought content normal.        Judgment: Judgment normal.      Assessment & Plan    1. Lower extremity edema Left lower extremity swelling.  We will get venous ultrasound to rule out DVT or superficial clot. - US Venous Img Lower Unilateral Left; Future  2. BMI 38.0-38.9,adult We will start Wegovy 0.25 mg weekly. Discussed lowering calorie intake to 1500 calories per day and incorporating exercise into daily routine to help lose weight.  - Semaglutide-Weight Management (WEGOVY) 0.25 MG/0.5ML SOAJ; Inject 0.25 mg into the skin once a week.  Dispense: 2 mL; Refill: 1    Problem List Items Addressed This Visit       Other   BMI 38.0-38.9,adult   Relevant Medications   Semaglutide-Weight Management (WEGOVY) 0.25 MG/0.5ML SOAJ   Lower extremity edema - Primary   Relevant Orders   US Venous Img Lower Unilateral Left (Completed)     Return in about 6  weeks (around 05/27/2022) for routine - weight management - started wegovy .         Ronnell Freshwater, NP  [facility] at [facility] [phone] (phone) [phone] (fax)  [place]

## 2022-04-15 ENCOUNTER — Encounter: Payer: Self-pay | Admitting: Nurse Practitioner

## 2022-04-15 ENCOUNTER — Ambulatory Visit (INDEPENDENT_AMBULATORY_CARE_PROVIDER_SITE_OTHER): Payer: BC Managed Care – PPO | Admitting: Nurse Practitioner

## 2022-04-15 VITALS — BP 104/67 | HR 66 | Temp 97.1°F | Ht 66.14 in | Wt 241.3 lb

## 2022-04-15 DIAGNOSIS — R600 Localized edema: Secondary | ICD-10-CM

## 2022-04-15 DIAGNOSIS — Z6838 Body mass index (BMI) 38.0-38.9, adult: Secondary | ICD-10-CM | POA: Diagnosis not present

## 2022-04-15 MED ORDER — WEGOVY 0.25 MG/0.5ML ~~LOC~~ SOAJ: 0.2500 mg | SUBCUTANEOUS | 1 refills | Status: DC

## 2022-04-17 ENCOUNTER — Encounter: Payer: Self-pay | Admitting: Nurse Practitioner

## 2022-04-17 ENCOUNTER — Ambulatory Visit
Admission: RE | Admit: 2022-04-17 | Discharge: 2022-04-17 | Disposition: A | Discharge status: Home / Self Care | Payer: BC Managed Care – PPO | Source: Ambulatory Visit | Attending: Nurse Practitioner | Admitting: Nurse Practitioner

## 2022-04-17 DIAGNOSIS — R600 Localized edema: Secondary | ICD-10-CM

## 2022-04-18 ENCOUNTER — Telehealth: Payer: Self-pay

## 2022-04-18 NOTE — Telephone Encounter (Signed)
Pt called wanting to start Ozempic pt stated she spoke with her Insurance they stated she will have to contact her PCP due to weight gain and health overall

## 2022-04-21 DIAGNOSIS — R600 Localized edema: Secondary | ICD-10-CM | POA: Insufficient documentation

## 2022-04-21 NOTE — Telephone Encounter (Signed)
Does she want me to change it from the [facility] which has already been sent?

## 2022-04-23 ENCOUNTER — Telehealth: Payer: Self-pay | Admitting: Nurse Practitioner

## 2022-04-23 NOTE — Telephone Encounter (Signed)
Patient is asking if you will prescribe her the Ozempic instead of Wegovy bc the [facility] is not covered by insurance and the [place] is. Please advise.

## 2022-04-24 ENCOUNTER — Other Ambulatory Visit: Payer: Self-pay | Admitting: Nurse Practitioner

## 2022-04-24 DIAGNOSIS — Z6838 Body mass index (BMI) 38.0-38.9, adult: Secondary | ICD-10-CM

## 2022-04-24 MED ORDER — OZEMPIC (0.25 OR 0.5 MG/DOSE) 2 MG/3ML ~~LOC~~ SOPN: 0.2500 mg | PEN_INJECTOR | SUBCUTANEOUS | 1 refills | Status: DC

## 2022-04-24 NOTE — Progress Notes (Signed)
Changed wegovy to ozempic 0.'25mg'$  weekly due to insurance not covering wegovy.

## 2022-04-24 NOTE — Telephone Encounter (Signed)
Please let the patient know that I Changed wegovy to ozempic 0.'25mg'$  weekly due to insurance not covering wegovy. I sent the new prescription to [facility]. Thanks so much.   -HB

## 2022-04-25 NOTE — Telephone Encounter (Signed)
Patient is aware 

## 2022-05-02 ENCOUNTER — Emergency Department (HOSPITAL_BASED_OUTPATIENT_CLINIC_OR_DEPARTMENT_OTHER)
Admission: EM | Admit: 2022-05-02 | Discharge: 2022-05-02 | Disposition: A | Discharge status: Home / Self Care | Payer: BC Managed Care – PPO | Attending: Emergency Medicine | Admitting: Emergency Medicine

## 2022-05-02 DIAGNOSIS — Z5321 Procedure and treatment not carried out due to patient leaving prior to being seen by health care provider: Secondary | ICD-10-CM | POA: Diagnosis not present

## 2022-05-02 DIAGNOSIS — I959 Hypotension, unspecified: Secondary | ICD-10-CM | POA: Diagnosis present

## 2022-05-02 DIAGNOSIS — R059 Cough, unspecified: Secondary | ICD-10-CM | POA: Diagnosis not present

## 2022-05-02 NOTE — ED Triage Notes (Signed)
Was sent by [facility] for hypotension . CXR done and COVID, FLU neg

## 2022-05-08 ENCOUNTER — Encounter: Payer: Self-pay | Admitting: Nurse Practitioner

## 2022-05-08 ENCOUNTER — Ambulatory Visit: Payer: BC Managed Care – PPO | Admitting: Nurse Practitioner

## 2022-05-08 ENCOUNTER — Other Ambulatory Visit: Payer: Self-pay | Admitting: Nurse Practitioner

## 2022-05-08 VITALS — BP 110/72 | HR 62 | Temp 98.0°F | Ht 66.14 in | Wt 236.4 lb

## 2022-05-08 DIAGNOSIS — J324 Chronic pansinusitis: Secondary | ICD-10-CM | POA: Diagnosis not present

## 2022-05-08 DIAGNOSIS — F418 Other specified anxiety disorders: Secondary | ICD-10-CM

## 2022-05-08 DIAGNOSIS — G2581 Restless legs syndrome: Secondary | ICD-10-CM | POA: Diagnosis not present

## 2022-05-08 DIAGNOSIS — R059 Cough, unspecified: Secondary | ICD-10-CM

## 2022-05-08 MED ORDER — CEFTRIAXONE SODIUM 1 G IJ SOLR
1.0000 g | INTRAMUSCULAR | Status: DC
  Administered 2022-05-08: 1 g via INTRAMUSCULAR

## 2022-05-08 MED ORDER — FLUOXETINE HCL 10 MG PO TABS: 10.0000 mg | ORAL_TABLET | Freq: Every day | ORAL | 2 refills | Status: DC

## 2022-05-08 NOTE — Progress Notes (Signed)
Established patient visit   Patient: [name] [name]   DOB: Apr 28, 1971   51 y.o. Female  MRN: [id number] Visit Date: 05/08/2022   Chief Complaint  Patient presents with   Follow-up   Subjective    HPI  Follow up from Urgent Care -went to urgent care 05/02/2022 due to cough, possible bronchitis, and severe itching from itching -was given steroid shot and nebulizer treatment  -continues to have cough.  -cough is much more severe at night. Coughs so loud she is having to sleep on the cough.  -blood pressure was a bit low due to dehydration.  --urgent care provider was very concerned about blood pressure of 98/64 and made her go to ER on 68. When she got there, she was told that her blood pressure was perfect. She did not stay to be seen.  -having a lot of trouble with her restless legs.  --increased her requip to 2 tablets. Added magnesium last night and slept much better last night.    Medications: Outpatient Medications Prior to Visit  Medication Sig   acetaminophen (TYLENOL) 500 MG tablet Take 500 mg by mouth every 6 (six) hours as needed.   Cholecalciferol (VITAMIN D3) 3000 units TABS Take 3,000 Units by mouth at bedtime.   furosemide (LASIX) 20 MG tablet Take 1 tablet (20 mg total) by mouth daily as needed. (Patient not taking: Reported on 05/08/2022)   rOPINIRole (REQUIP) 0.5 MG tablet Take 1 to 2 tablets po QHS for RLS (Patient not taking: Reported on 04/15/2022)   Semaglutide,0.25 or 0.'5MG'$ /DOS, (OZEMPIC, 0.25 OR 0.5 MG/DOSE,) 2 MG/3ML SOPN Inject 0.25 mg into the skin once a week.   triamcinolone ointment (KENALOG) 0.1 % Apply 1 application. topically 2 (two) times daily. (Patient not taking: Reported on 04/15/2022)   [DISCONTINUED] ciclopirox (PENLAC) 8 % solution Apply topically at bedtime. Apply over nail and surrounding skin. Apply daily over previous coat. After seven (7) days, may remove with alcohol and continue cycle.   [DISCONTINUED] FLUoxetine (PROZAC) 10 MG tablet Take 1 tablet  (10 mg total) by mouth daily.   No facility-administered medications prior to visit.    Review of Systems  Constitutional:  Negative for activity change, appetite change, chills, fatigue and fever.       Five pound weight loss since she was last seen   HENT:  Positive for congestion and postnasal drip. Negative for rhinorrhea, sinus pressure, sinus pain, sneezing and sore throat.   Eyes: Negative.   Respiratory:  Positive for cough and wheezing. Negative for chest tightness and shortness of breath.   Cardiovascular:  Negative for chest pain, palpitations and leg swelling.       Blood pressure doing well.   Gastrointestinal:  Negative for abdominal pain, constipation, diarrhea, nausea and vomiting.  Endocrine: Negative for cold intolerance, heat intolerance, polydipsia and polyuria.  Genitourinary:  Negative for dyspareunia, dysuria, flank pain, frequency and urgency.  Musculoskeletal:  Negative for arthralgias, back pain and myalgias.  Skin:  Negative for rash.  Allergic/Immunologic: Negative for environmental allergies.  Neurological:  Negative for dizziness, weakness and headaches.  Hematological:  Negative for adenopathy.  Psychiatric/Behavioral:  Positive for dysphoric mood. The patient is not nervous/anxious.        Objective     Today's Vitals   05/08/22 0938  BP: 110/72  Pulse: 62  Temp: 98 F (36.7 C)  Weight: 236 lb 6.4 oz (107.2 kg)  Height: 5' 6.14" (1.68 m)   Body mass index is 37.99 kg/m.  BP Readings from Last 3 Encounters:  05/08/22 110/72  04/15/22 104/67  03/22/22 110/71    Wt Readings from Last 3 Encounters:  05/08/22 236 lb 6.4 oz (107.2 kg)  04/15/22 241 lb 4.8 oz (109.5 kg)  03/22/22 237 lb 1.9 oz (107.6 kg)    Physical Exam Vitals and nursing note reviewed.  Constitutional:      Appearance: Normal appearance. She is well-developed. She is obese.  HENT:     Head: Normocephalic and atraumatic.     Right Ear: Tympanic membrane, ear canal  and external ear normal.     Left Ear: Tympanic membrane, ear canal and external ear normal.     Nose: Congestion present.     Mouth/Throat:     Mouth: Mucous membranes are moist.     Pharynx: Oropharynx is clear. Posterior oropharyngeal erythema present.  Eyes:     Extraocular Movements: Extraocular movements intact.     Conjunctiva/sclera: Conjunctivae normal.     Pupils: Pupils are equal, round, and reactive to light.  Cardiovascular:     Rate and Rhythm: Normal rate and regular rhythm.     Pulses: Normal pulses.     Heart sounds: Normal heart sounds.  Pulmonary:     Effort: Pulmonary effort is normal.     Breath sounds: Normal breath sounds.     Comments: Dry, non productive cough Abdominal:     Palpations: Abdomen is soft.  Musculoskeletal:        General: Normal range of motion.     Cervical back: Normal range of motion and neck supple.  Lymphadenopathy:     Cervical: Cervical adenopathy present.  Skin:    General: Skin is warm and dry.     Capillary Refill: Capillary refill takes less than 2 seconds.  Neurological:     General: No focal deficit present.     Mental Status: She is alert and oriented to person, place, and time.  Psychiatric:        Mood and Affect: Mood normal.        Behavior: Behavior normal.        Thought Content: Thought content normal.        Judgment: Judgment normal.       Assessment & Plan    1. Pansinusitis, unspecified chronicity Injection 1 g Rocephin given in the office today.  Patient tolerated this well. Rest and increase fluids. Continue using OTC medication to control symptoms.   - cefTRIAXone (ROCEPHIN) injection 1 g  2. Cough, unspecified type Injection 1 g Rocephin given in the office today.  Advised her to use rescue inhaler as needed and as prescribed for acute cough.  Recommended OTC [place] DM as needed and as indicated for cough. - cefTRIAXone (ROCEPHIN) injection 1 g  3. Situational anxiety Trial of fluoxetine 10 mg  daily.  Reassess in 6 weeks. - FLUoxetine (PROZAC) 10 MG tablet; Take 1 tablet (10 mg total) by mouth daily.  Dispense: 30 tablet; Refill: 2  4. Restless leg syndrome Continue Requip at current dose.  Problem List Items Addressed This Visit       Respiratory   Pansinusitis - Primary   Relevant Medications   cefTRIAXone (ROCEPHIN) injection 1 g     Other   Situational anxiety   Relevant Medications   FLUoxetine (PROZAC) 10 MG tablet   Restless leg syndrome   Cough   Relevant Medications   cefTRIAXone (ROCEPHIN) injection 1 g     Return in about 6 weeks (  around 06/19/2022) for routine - weight management.         Ronnell Freshwater, NP  [facility] at [facility] [phone] (phone) [phone] (fax)  [place]

## 2022-05-12 DIAGNOSIS — J324 Chronic pansinusitis: Secondary | ICD-10-CM | POA: Insufficient documentation

## 2022-05-12 DIAGNOSIS — R059 Cough, unspecified: Secondary | ICD-10-CM | POA: Insufficient documentation

## 2022-06-13 ENCOUNTER — Other Ambulatory Visit: Payer: Self-pay | Admitting: Nurse Practitioner

## 2022-06-13 DIAGNOSIS — G2581 Restless legs syndrome: Secondary | ICD-10-CM

## 2022-06-19 ENCOUNTER — Ambulatory Visit: Payer: BC Managed Care – PPO | Admitting: Nurse Practitioner

## 2022-06-27 ENCOUNTER — Encounter: Payer: Self-pay | Admitting: Nurse Practitioner

## 2022-06-27 ENCOUNTER — Ambulatory Visit (INDEPENDENT_AMBULATORY_CARE_PROVIDER_SITE_OTHER): Payer: BC Managed Care – PPO | Admitting: Nurse Practitioner

## 2022-06-27 VITALS — Ht 66.0 in | Wt 236.0 lb

## 2022-06-27 DIAGNOSIS — G2581 Restless legs syndrome: Secondary | ICD-10-CM | POA: Diagnosis not present

## 2022-06-27 DIAGNOSIS — F418 Other specified anxiety disorders: Secondary | ICD-10-CM | POA: Diagnosis not present

## 2022-06-27 DIAGNOSIS — Z6838 Body mass index (BMI) 38.0-38.9, adult: Secondary | ICD-10-CM | POA: Diagnosis not present

## 2022-06-27 DIAGNOSIS — K581 Irritable bowel syndrome with constipation: Secondary | ICD-10-CM | POA: Diagnosis not present

## 2022-06-27 MED ORDER — FLUOXETINE HCL 10 MG PO TABS: 10.0000 mg | ORAL_TABLET | Freq: Every day | ORAL | 2 refills | Status: DC

## 2022-06-27 MED ORDER — LINACLOTIDE 145 MCG PO CAPS: 145.0000 ug | ORAL_CAPSULE | Freq: Every day | ORAL | 1 refills | Status: DC

## 2022-06-27 MED ORDER — ROPINIROLE HCL 1 MG PO TABS: ORAL_TABLET | ORAL | 1 refills | Status: DC

## 2022-06-27 NOTE — Progress Notes (Signed)
Virtual Visit via Telephone Note  I connected with [name] [name] on 06/27/22 at  9:50 AM EDT by telephone and verified that I am speaking with the correct person using two identifiers.  Location: Patient: home  Provider: home    I discussed the limitations, risks, security and privacy concerns of performing an evaluation and management service by telephone and the availability of in person appointments. I also discussed with the patient that there may be a patient responsible charge related to this service. The patient expressed understanding and agreed to proceed.   History of Present Illness: Patient presents for follow up for weight management.  Started ozempic in early 04/2022  Initial weight 04/15/2022 - 241 Most recent weight 05/08/2022 - 236 Today's weight (reported) - 06/27/2022 - 238 d lose 9 pounds prior to stopping ozempic).  Total weight loss  since taking ozempic - 3 pounds ( Has stopped taking ozempic due to severe constipation. Denies abdominal pain or tenderness, but states  that she was just unable to have bowel movement.  Since stopping the ozempic, she has been taking OTC gentle laxative. She states  that she is now moving her bowels one or two times weekly.   Fluoxetine - causing severe  restless legs. Feels like it is now going into her arms and hand. States that this is only severe at night. States that even increasing the ropinerole to 1 mg at bedtime has not been helpful      Observations/Objective:  The patient is alert and oriented. She is pleasant and answers all questions appropriately. Breathing is non-labored. She is in no acute distress at this time.    Assessment and Plan:  1. Irritable bowel syndrome with constipation Trial linzess 145 mcg daily. Advised patient to increase  fiber and water in her diet. May hold if causes diarrhea. Reassess in six weeks  - linaclotide (LINZESS) 145 MCG CAPS capsule; Take 1 capsule (145 mcg total) by mouth daily before  breakfast.  Dispense: 30 capsule; Refill: 1  2. Restless leg syndrome Increase  ropinirole to 1 mg. She may take 1 to 2 tablets at bedtime as needed. Consider dose change if noted improvement achieved.  - rOPINIRole (REQUIP) 1 MG tablet; TAKE 1 TO 2 TABLETS BY MOUTH AT BEDTIME FOR RESTLESS LEG SYNDROME  Dispense: 60 tablet; Refill: 1  3. BMI 38.0-38.9,adult Will have her restart ozempic 0.25 mg weekly along with taking Linzess 145 mcg  daily. Discussed lowering calorie intake to 1500 calories per day and incorporating exercise into daily routine to help lose weight. Reassess in 6 weeks   4. Situational anxiety Will have patient wean down prozac until she is completely off this medication. Verbal instructions were given for this process. She voiced understanding.  Reassess in 6 weeks.  - FLUoxetine (PROZAC) 10 MG tablet; Take 1 tablet (10 mg total) by mouth daily.  Dispense: 30 tablet; Refill: 2   Follow Up Instructions:    I discussed the assessment and treatment plan with the patient. The patient was provided an opportunity to ask questions and all were answered. The patient agreed with the plan and demonstrated an understanding of the instructions.   The patient was advised to call back or seek an in-person evaluation if the symptoms worsen or if the condition fails to improve as anticipated.  I provided 20 minutes of non-face-to-face time during this encounter.   Ronnell Freshwater, NP

## 2022-07-10 ENCOUNTER — Telehealth: Payer: Self-pay | Admitting: Nurse Practitioner

## 2022-07-10 NOTE — Telephone Encounter (Signed)
Patients insurance is now denying the Ozempic, can you please switch her to something else?

## 2022-07-18 ENCOUNTER — Other Ambulatory Visit: Payer: Self-pay | Admitting: Nurse Practitioner

## 2022-07-18 DIAGNOSIS — Z6838 Body mass index (BMI) 38.0-38.9, adult: Secondary | ICD-10-CM

## 2022-07-18 MED ORDER — SAXENDA 18 MG/3ML ~~LOC~~ SOPN: PEN_INJECTOR | SUBCUTANEOUS | 1 refills | Status: DC

## 2022-07-18 MED ORDER — INSULIN PEN NEEDLE 31G X 5 MM MISC: 1 refills | Status: DC

## 2022-07-18 NOTE — Telephone Encounter (Signed)
I have changed the prescription to saxenda. This is daily injection and very similar medication to the wegovy. She will start with 0.6 mg daily for a week and then go up to the next dose weekly, as tolerated. If there is a dose that causes negative side effects, she should go back to prior dose and advance dosing a bit more slowly. I did sent this along with pen needles needed for this prescription to [facility].

## 2022-07-18 NOTE — Telephone Encounter (Signed)
Can you please address this? Her weight has went back up to 242. Thank you.

## 2022-07-18 NOTE — Telephone Encounter (Signed)
Patient aware.

## 2022-07-22 NOTE — Telephone Encounter (Signed)
Please let the patient know that this is an issue with her insurance, not [place]. They stopped covering her ozempic and are now denying coverage of her Saxenda. Wegovy at the starting two doses is nearly impossible to find at the pharmacies. These are all factors I cannot control.

## 2022-07-22 NOTE — Telephone Encounter (Addendum)
Patient called office because she hasn't gotten her saxenda and patient has had an issue concerning getting her prescription lately, please give patient a call at your earliest convenience thanks!

## 2022-07-23 NOTE — Telephone Encounter (Signed)
Pt will make an appt to discuss option

## 2022-07-23 NOTE — Telephone Encounter (Signed)
With her blood pressure doing well and being low, we could do a 30 day trial of phentermine. If we did that, she would have to make appointment to be seen in one month to see about continuing the medication.

## 2022-07-23 NOTE — Telephone Encounter (Signed)
Called pt LVM to contact the office °

## 2022-07-25 ENCOUNTER — Telehealth: Payer: Self-pay | Admitting: Nurse Practitioner

## 2022-07-25 NOTE — Telephone Encounter (Signed)
Patient is asking where the PA stands on the Saxenda and if you could please send over current weight and state she has never been on this medication before. Please advise.

## 2022-07-25 NOTE — Telephone Encounter (Signed)
Sent message through mychart

## 2022-08-07 ENCOUNTER — Encounter: Payer: Self-pay | Admitting: Nurse Practitioner

## 2022-08-07 ENCOUNTER — Ambulatory Visit (INDEPENDENT_AMBULATORY_CARE_PROVIDER_SITE_OTHER): Payer: BC Managed Care – PPO | Admitting: Nurse Practitioner

## 2022-08-07 ENCOUNTER — Ambulatory Visit: Payer: BC Managed Care – PPO | Admitting: Nurse Practitioner

## 2022-08-07 VITALS — BP 102/62 | HR 63 | Ht 66.0 in | Wt 244.0 lb

## 2022-08-07 DIAGNOSIS — Z6839 Body mass index (BMI) 39.0-39.9, adult: Secondary | ICD-10-CM | POA: Diagnosis not present

## 2022-08-07 DIAGNOSIS — Z1239 Encounter for other screening for malignant neoplasm of breast: Secondary | ICD-10-CM | POA: Diagnosis not present

## 2022-08-07 DIAGNOSIS — Z803 Family history of malignant neoplasm of breast: Secondary | ICD-10-CM

## 2022-08-07 DIAGNOSIS — M1711 Unilateral primary osteoarthritis, right knee: Secondary | ICD-10-CM

## 2022-08-07 DIAGNOSIS — Z9889 Other specified postprocedural states: Secondary | ICD-10-CM | POA: Insufficient documentation

## 2022-08-07 MED ORDER — SAXENDA 18 MG/3ML ~~LOC~~ SOPN: PEN_INJECTOR | SUBCUTANEOUS | 1 refills | Status: DC

## 2022-08-07 MED ORDER — INSULIN PEN NEEDLE 31G X 5 MM MISC: 1 refills | Status: DC

## 2022-08-07 NOTE — Progress Notes (Signed)
Established patient visit   Patient: [name] [name]   DOB: 1971/03/01   51 y.o. Female  MRN: [id number] Visit Date: 08/07/2022   Chief Complaint  Patient presents with   Medication Refill   Subjective    HPI  Patient presents for follow up for weight management.  Started ozempic in early 04/2022  Initial weight 04/15/2022 - 75 Most recent weight 06/27/2022 - 74 Today's weight 08/07/2022 - 244  She had to stop ozempic as it caused severe constipation. Tried prescription for saxenda. Was denied by her insurance as they believed she had already tried it and it didn't work.  -patient has spoken to insurance and they told her to have prescription resubmitted and to add that she has not taken this medicatioin in the past.   -she is due to have breast cancer screening. Has positive BRCA gene. Has had double mastectomy with bilateral breast reconstruction. Last Mri of breasts was done 11/2020 and results were benign.   Medications: Outpatient Medications Prior to Visit  Medication Sig   acetaminophen (TYLENOL) 500 MG tablet Take 500 mg by mouth every 6 (six) hours as needed.   Cholecalciferol (VITAMIN D3) 3000 units TABS Take 3,000 Units by mouth at bedtime.   FLUoxetine (PROZAC) 10 MG tablet Take 1 tablet (10 mg total) by mouth daily.   furosemide (LASIX) 20 MG tablet Take 1 tablet (20 mg total) by mouth daily as needed.   linaclotide (LINZESS) 145 MCG CAPS capsule Take 1 capsule (145 mcg total) by mouth daily before breakfast.   rOPINIRole (REQUIP) 1 MG tablet TAKE 1 TO 2 TABLETS BY MOUTH AT BEDTIME FOR RESTLESS LEG SYNDROME   triamcinolone ointment (KENALOG) 0.1 % Apply 1 application. topically 2 (two) times daily.   [DISCONTINUED] Insulin Pen Needle 31G X 5 MM MISC To use daily with saxenda injectoins   [DISCONTINUED] Liraglutide -Weight Management (SAXENDA) 18 MG/3ML SOPN Inject 0.6 mg [place] once daily x 1 wk. Then increase dose by 0.6 mg/day every 7 days to target of 3 mg/day.   No  facility-administered medications prior to visit.    Review of Systems  Constitutional:  Negative for activity change, appetite change, chills, fatigue and fever.       Weight gain 8 pound since last visit   HENT:  Negative for congestion, postnasal drip, rhinorrhea, sinus pressure, sinus pain, sneezing and sore throat.   Eyes: Negative.   Respiratory:  Negative for cough, chest tightness, shortness of breath and wheezing.   Cardiovascular:  Negative for chest pain and palpitations.  Gastrointestinal:  Negative for abdominal pain, constipation, diarrhea, nausea and vomiting.  Endocrine: Negative for cold intolerance, heat intolerance, polydipsia and polyuria.  Genitourinary:  Negative for dyspareunia, dysuria, flank pain, frequency and urgency.  Musculoskeletal:  Positive for arthralgias and joint swelling. Negative for back pain and myalgias.       Mostly pain and swelling of right knee   Skin:  Negative for rash.  Allergic/Immunologic: Negative for environmental allergies.  Neurological:  Negative for dizziness, weakness and headaches.  Hematological:  Negative for adenopathy.  Psychiatric/Behavioral:  The patient is not nervous/anxious.        Objective     Today's Vitals   08/07/22 1126  BP: 102/62  Pulse: 63  SpO2: 97%  Weight: 244 lb (110.7 kg)  Height: '5\' 6"'  (1.676 m)   Body mass index is 39.38 kg/m.   BP Readings from Last 3 Encounters:  08/07/22 102/62  05/08/22 110/72  04/15/22 104/67  Wt Readings from Last 3 Encounters:  08/07/22 244 lb (110.7 kg)  06/27/22 236 lb (107 kg)  05/08/22 236 lb 6.4 oz (107.2 kg)    Physical Exam Vitals and nursing note reviewed.  Constitutional:      Appearance: Normal appearance. She is well-developed. She is obese.  HENT:     Head: Normocephalic and atraumatic.     Nose: Nose normal.  Eyes:     Conjunctiva/sclera: Conjunctivae normal.     Pupils: Pupils are equal, round, and reactive to light.  Cardiovascular:      Rate and Rhythm: Normal rate and regular rhythm.     Pulses: Normal pulses.     Heart sounds: Normal heart sounds.  Pulmonary:     Effort: Pulmonary effort is normal.     Breath sounds: Normal breath sounds.  Abdominal:     Palpations: Abdomen is soft.  Musculoskeletal:        General: Swelling and tenderness present. Normal range of motion.     Cervical back: Normal range of motion and neck supple.     Comments: Right knee pain and swelling. More severe today when weather is cold and damp. No bony abnormalities or deformities noted   Lymphadenopathy:     Cervical: No cervical adenopathy.  Skin:    General: Skin is warm and dry.     Capillary Refill: Capillary refill takes less than 2 seconds.  Neurological:     General: No focal deficit present.     Mental Status: She is alert and oriented to person, place, and time.  Psychiatric:        Mood and Affect: Mood normal.        Behavior: Behavior normal.        Thought Content: Thought content normal.        Judgment: Judgment normal.       Assessment & Plan    1. Primary osteoarthritis of right knee Apply a compressive ACE bandage. Rest and elevate the affected painful area.  Apply cold compresses intermittently as needed.  Recommend she take tylenol and/or aleve to reduce pain and inflammation. Encouraged her to contact orthopedic surgeon if this is persistent. .  2. BMI 39.0-39.9,adult New prescription for axenda sent to her pharmacy with instructions for titration. It is noted on the prescription that she has not been on this medication previously. Discussed lowering calorie intake to 1500 calories per day and incorporating exercise into daily routine to help lose weight. - Liraglutide -Weight Management (SAXENDA) 18 MG/3ML SOPN; Inject 0.6 mg [place] once daily x 1 wk. Then increase dose by 0.6 mg/day every 7 days to target of 3 mg/day. **THE PATIENT HAS NOT BEEN ON THIS MEDICATION IN THE PAST.**  Dispense: 15 mL; Refill: 1 - Insulin  Pen Needle 31G X 5 MM MISC; To use daily with saxenda injectoins  Dispense: 100 each; Refill: 1  3. Encounter for breast cancer screening using non-mammogram modality New MR bilateral breasts with and without contrast has been ordered for breast cancer screening.  - MR BREAST BILATERAL W WO CONTRAST INC CAD; Future  4. Family history of breast cancer New MR bilateral breasts with and without contrast has been ordered for breast cancer screening. She does is positive fr BRCA gene.  - MR BREAST BILATERAL W WO CONTRAST INC CAD; Future  5. S/P breast reconstruction, bilateral New MR bilateral breasts with and without contrast has been ordered for breast cancer screening. She does is positive fr BRCA gene.  She has had prophylactic bilateral mastectomy and breast reconstruction.  - MR BREAST BILATERAL W WO CONTRAST INC CAD; Future   Problem List Items Addressed This Visit       Musculoskeletal and Integument   Primary osteoarthritis of left knee - Primary     Other   BMI 38.0-38.9,adult   Relevant Medications   Liraglutide -Weight Management (SAXENDA) 18 MG/3ML SOPN   Insulin Pen Needle 31G X 5 MM MISC   Family history of breast cancer   Relevant Orders   MR BREAST BILATERAL W WO CONTRAST INC CAD   S/P breast reconstruction, bilateral   Relevant Orders   MR BREAST BILATERAL W WO CONTRAST INC CAD   Other Visit Diagnoses     Encounter for breast cancer screening using non-mammogram modality       Relevant Orders   MR BREAST BILATERAL W WO CONTRAST INC CAD        Return in about 6 weeks (around 09/18/2022) for routine - weight management.         Ronnell Freshwater, NP  [facility] at [facility] [phone] (phone) [phone] (fax)  [place]

## 2022-08-08 ENCOUNTER — Telehealth: Payer: Self-pay

## 2022-08-08 NOTE — Telephone Encounter (Signed)
Can you find out from the patient, what the problem is with her insurance?  Thanks.

## 2022-08-08 NOTE — Telephone Encounter (Signed)
Called pt LVM to contact the office °

## 2022-08-08 NOTE — Telephone Encounter (Signed)
Patient called office stating that pharmacy states Nira Conn should speak with her insurance regarding saxenda, please advise, thanks!

## 2022-08-09 NOTE — Telephone Encounter (Signed)
Called pt she stated that the pharmacy send a request back to the provider the insurance company want ed the provider to reach out

## 2022-08-12 NOTE — Telephone Encounter (Signed)
Please let her know that on our end, the insurance is just denying the new prescription for saxenda. They are not giving [place] the ability to file anything for prior authorization. If she has a number she wants me to call, I am willing to try that. Please lett her that I have had other patients take this to their human resources drectors who have presented this to the insurance and got better and faster results than I have.  Thanks  -HB

## 2022-08-12 NOTE — Telephone Encounter (Signed)
Is this a CoverMyMeds thing? I did see that we got a request last Thursday before I left.

## 2022-08-12 NOTE — Telephone Encounter (Signed)
Are they requesting a peer to peer?

## 2022-08-12 NOTE — Telephone Encounter (Signed)
No it saying denid

## 2022-08-12 NOTE — Telephone Encounter (Signed)
Ok

## 2022-08-12 NOTE — Telephone Encounter (Signed)
Yes

## 2022-08-24 ENCOUNTER — Other Ambulatory Visit: Payer: BC Managed Care – PPO

## 2022-08-31 ENCOUNTER — Other Ambulatory Visit: Payer: BC Managed Care – PPO

## 2022-09-02 ENCOUNTER — Telehealth: Payer: Self-pay

## 2022-09-02 NOTE — Telephone Encounter (Signed)
PA is approved 

## 2022-09-02 NOTE — Telephone Encounter (Signed)
[name] [name] called from imaging center stating that the Pt needs a Auth prior to the appt for the MR [place] CAD being scheduled. She is requesting to have the Auth number by 2pm today.  [name] [name] cb number is [phone]

## 2022-09-13 ENCOUNTER — Other Ambulatory Visit (HOSPITAL_COMMUNITY): Payer: Self-pay | Admitting: Orthopedic Surgery

## 2022-09-13 DIAGNOSIS — M25561 Pain in right knee: Secondary | ICD-10-CM

## 2022-09-18 ENCOUNTER — Ambulatory Visit: Payer: BC Managed Care – PPO | Admitting: Nurse Practitioner

## 2022-09-19 ENCOUNTER — Other Ambulatory Visit (HOSPITAL_COMMUNITY): Payer: BC Managed Care – PPO

## 2022-09-19 ENCOUNTER — Encounter (HOSPITAL_COMMUNITY): Payer: Self-pay

## 2022-09-21 ENCOUNTER — Ambulatory Visit
Admission: RE | Admit: 2022-09-21 | Discharge: 2022-09-21 | Disposition: A | Discharge status: Home / Self Care | Payer: BC Managed Care – PPO | Source: Ambulatory Visit | Attending: Nurse Practitioner | Admitting: Nurse Practitioner

## 2022-09-21 DIAGNOSIS — Z9889 Other specified postprocedural states: Secondary | ICD-10-CM

## 2022-09-21 DIAGNOSIS — Z1239 Encounter for other screening for malignant neoplasm of breast: Secondary | ICD-10-CM

## 2022-09-21 DIAGNOSIS — Z803 Family history of malignant neoplasm of breast: Secondary | ICD-10-CM

## 2022-09-21 MED ORDER — GADOPICLENOL 0.5 MMOL/ML IV SOLN
10.0000 mL | Freq: Once | INTRAVENOUS | Status: AC | PRN
  Administered 2022-09-21: 10 mL via INTRAVENOUS

## 2022-09-23 ENCOUNTER — Encounter: Payer: Self-pay | Admitting: Nurse Practitioner

## 2022-09-24 NOTE — Progress Notes (Unsigned)
Established patient visit   Patient: [name] [name]   DOB: 02/28/1971   51 y.o. Female  MRN: [id number] Visit Date: 09/25/2022   No chief complaint on file.  Subjective    HPI  ***   Medications: Outpatient Medications Prior to Visit  Medication Sig   acetaminophen (TYLENOL) 500 MG tablet Take 500 mg by mouth every 6 (six) hours as needed.   Cholecalciferol (VITAMIN D3) 3000 units TABS Take 3,000 Units by mouth at bedtime.   FLUoxetine (PROZAC) 10 MG tablet Take 1 tablet (10 mg total) by mouth daily.   furosemide (LASIX) 20 MG tablet Take 1 tablet (20 mg total) by mouth daily as needed.   Insulin Pen Needle 31G X 5 MM MISC To use daily with saxenda injectoins   linaclotide (LINZESS) 145 MCG CAPS capsule Take 1 capsule (145 mcg total) by mouth daily before breakfast.   Liraglutide -Weight Management (SAXENDA) 18 MG/3ML SOPN Inject 0.6 mg [place] once daily x 1 wk. Then increase dose by 0.6 mg/day every 7 days to target of 3 mg/day. **THE PATIENT HAS NOT BEEN ON THIS MEDICATION IN THE PAST.**   rOPINIRole (REQUIP) 1 MG tablet TAKE 1 TO 2 TABLETS BY MOUTH AT BEDTIME FOR RESTLESS LEG SYNDROME   triamcinolone ointment (KENALOG) 0.1 % Apply 1 application. topically 2 (two) times daily.   No facility-administered medications prior to visit.    Review of Systems  {Labs (Optional):23779}   Objective    There were no vitals filed for this visit. There is no height or weight on file to calculate BMI.  BP Readings from Last 3 Encounters:  08/07/22 102/62  05/08/22 110/72  04/15/22 104/67    Wt Readings from Last 3 Encounters:  08/07/22 244 lb (110.7 kg)  06/27/22 236 lb (107 kg)  05/08/22 236 lb 6.4 oz (107.2 kg)    Physical Exam  ***  No results found for any visits on 09/25/22.  Assessment & Plan     Problem List Items Addressed This Visit   None    No follow-ups on file.         Ronnell Freshwater, NP  [facility] at [facility] [phone]  (phone) [phone] (fax)  [place]

## 2022-09-25 ENCOUNTER — Ambulatory Visit (INDEPENDENT_AMBULATORY_CARE_PROVIDER_SITE_OTHER): Payer: BC Managed Care – PPO | Admitting: Nurse Practitioner

## 2022-09-25 ENCOUNTER — Encounter: Payer: Self-pay | Admitting: Nurse Practitioner

## 2022-09-25 VITALS — BP 102/63 | HR 68 | Ht 66.0 in | Wt 229.8 lb

## 2022-09-25 DIAGNOSIS — R0683 Snoring: Secondary | ICD-10-CM

## 2022-09-25 DIAGNOSIS — G4719 Other hypersomnia: Secondary | ICD-10-CM | POA: Diagnosis not present

## 2022-09-25 DIAGNOSIS — Z6837 Body mass index (BMI) 37.0-37.9, adult: Secondary | ICD-10-CM | POA: Diagnosis not present

## 2022-09-25 DIAGNOSIS — M1711 Unilateral primary osteoarthritis, right knee: Secondary | ICD-10-CM

## 2022-09-27 ENCOUNTER — Ambulatory Visit (HOSPITAL_COMMUNITY): Payer: BC Managed Care – PPO

## 2022-09-27 ENCOUNTER — Encounter (HOSPITAL_COMMUNITY): Payer: BC Managed Care – PPO

## 2022-10-02 ENCOUNTER — Encounter (HOSPITAL_COMMUNITY)
Admission: RE | Admit: 2022-10-02 | Discharge: 2022-10-02 | Disposition: A | Discharge status: Home / Self Care | Payer: BC Managed Care – PPO | Source: Ambulatory Visit | Attending: Orthopedic Surgery | Admitting: Orthopedic Surgery

## 2022-10-02 DIAGNOSIS — M25561 Pain in right knee: Secondary | ICD-10-CM | POA: Insufficient documentation

## 2022-10-02 MED ORDER — TECHNETIUM TC 99M MEDRONATE IV KIT
20.0000 | PACK | Freq: Once | INTRAVENOUS | Status: AC | PRN
  Administered 2022-10-02: 20 via INTRAVENOUS

## 2022-11-30 IMAGING — US US EXTREM LOW VENOUS*L*
1 series · 14 of 24 positions shown · non-contrast
Comparison: None Available.

CLINICAL DATA: LEFT lower extremity edema

EXAM:
LEFT LOWER EXTREMITY VENOUS DOPPLER ULTRASOUND
TECHNIQUE: Gray-scale sonography with compression, as well as color and duplex
ultrasound, were performed to evaluate the deep venous system(s)
from the level of the common femoral vein through the popliteal and
proximal calf veins.

[Series 1: us extrem low venous*left* · 0.07mm/px · 14 of 34 slices shown]
[im 1/34]
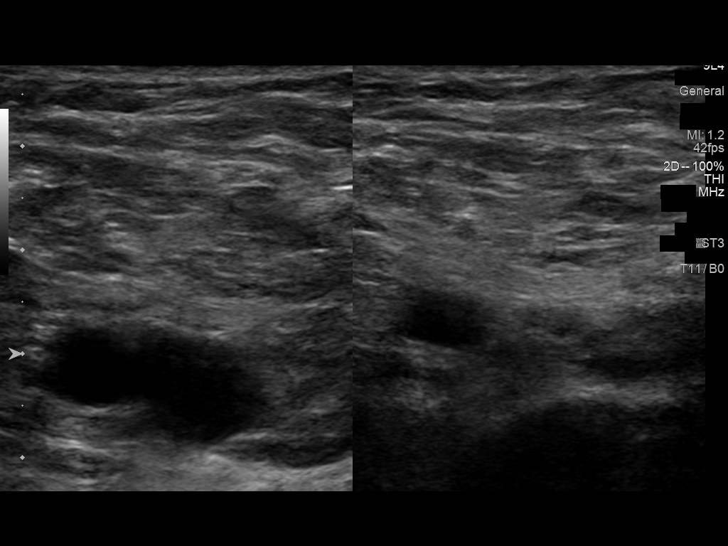
[im 3/34]
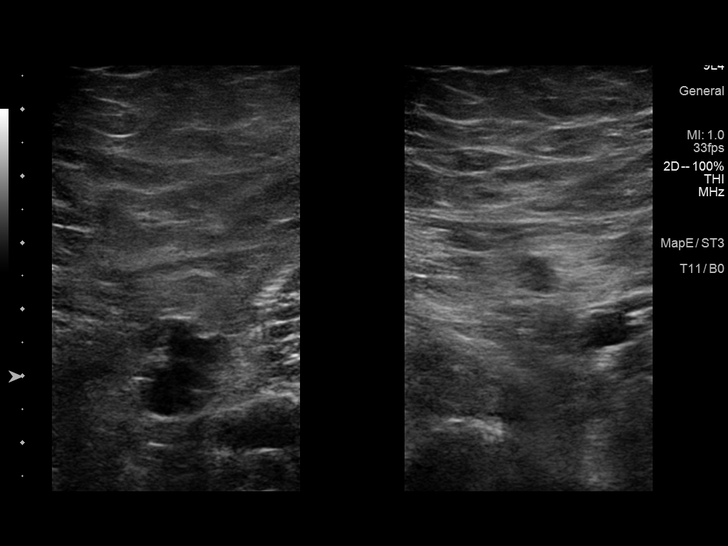
[im 6/34]
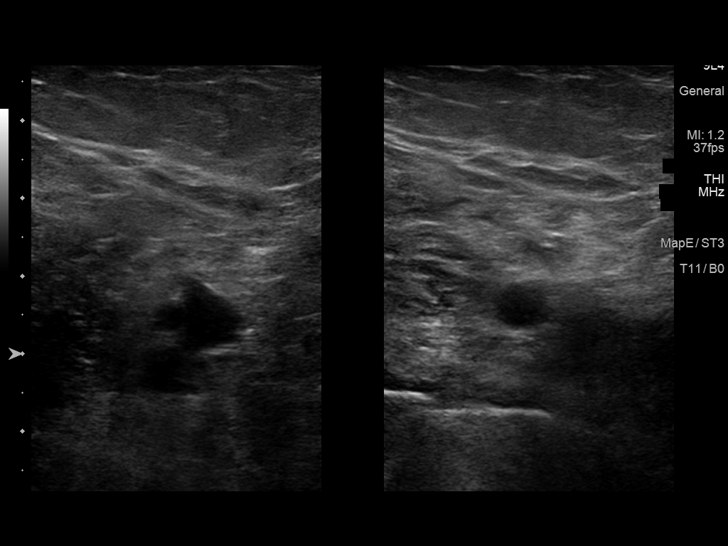
[im 9/34]
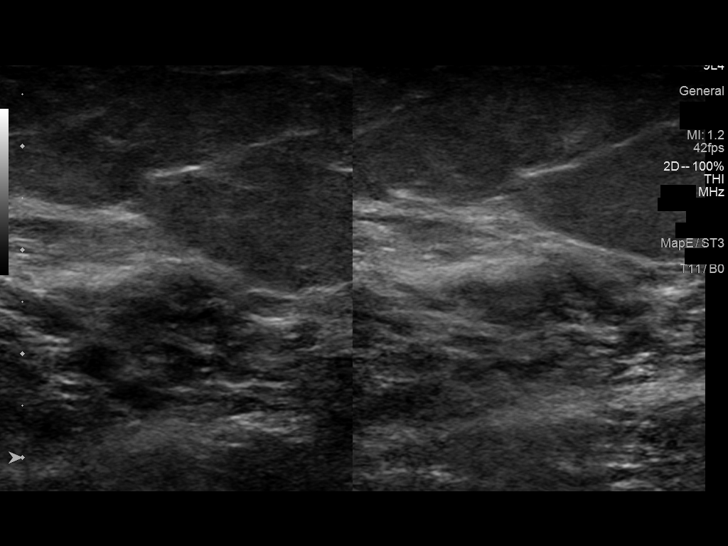
[im 11/34]
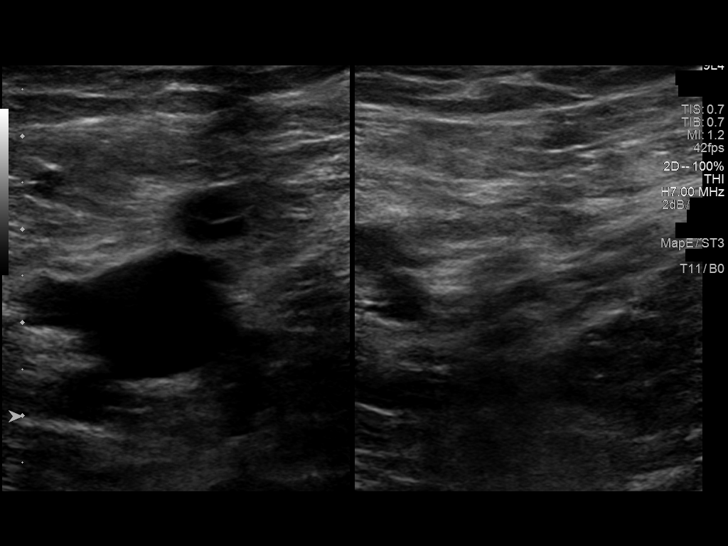
[im 13/34]
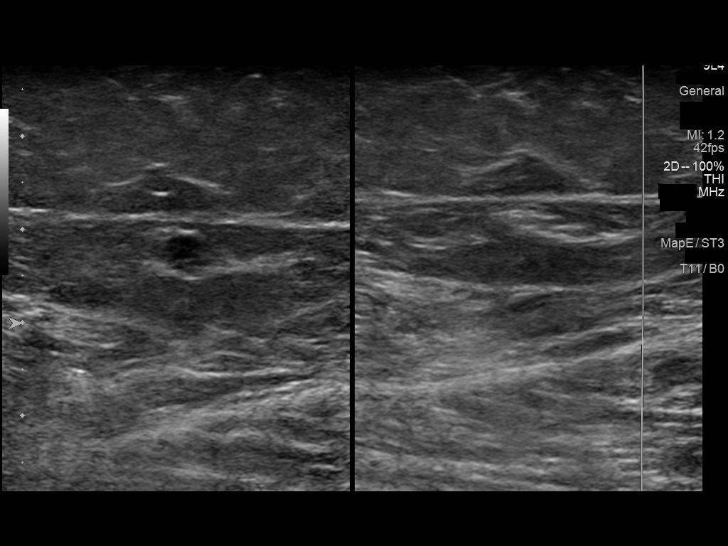
[im 16/34]
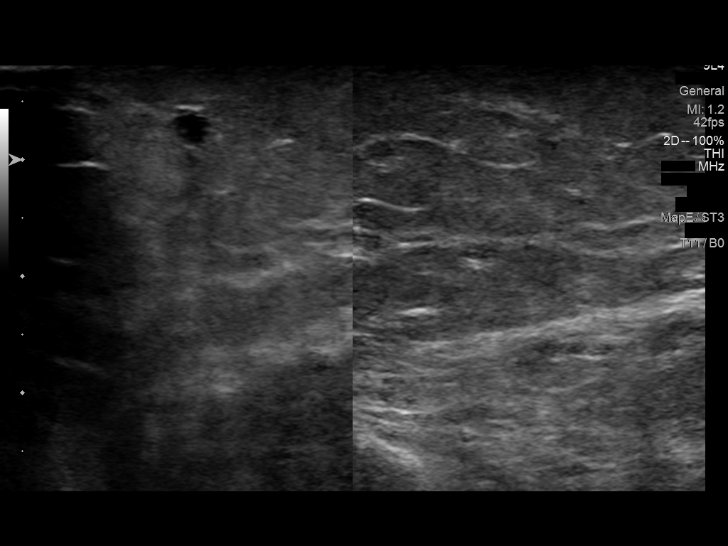
[im 18/34]
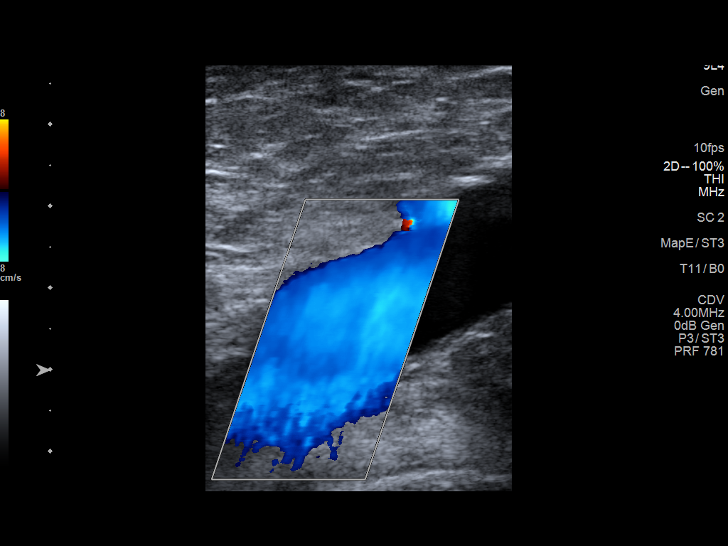
[im 21/34]
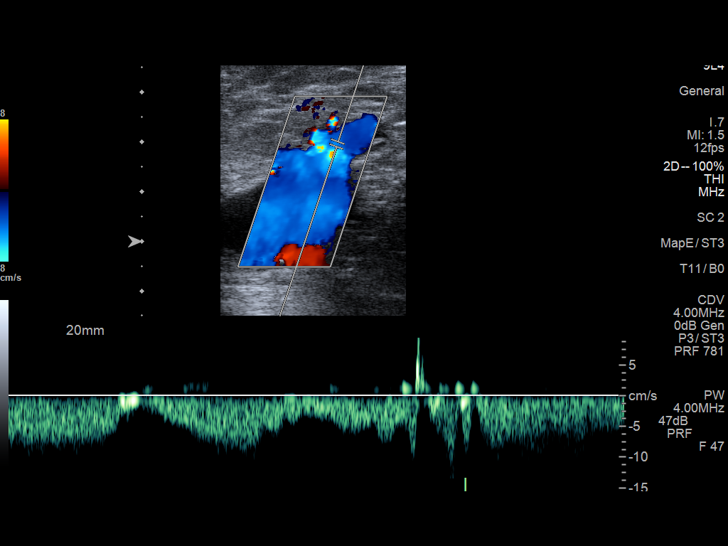
[im 23/34]
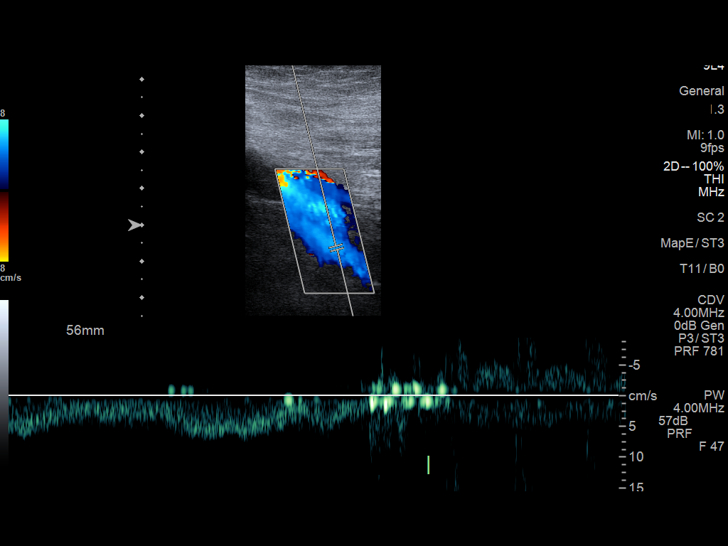
[im 26/34]
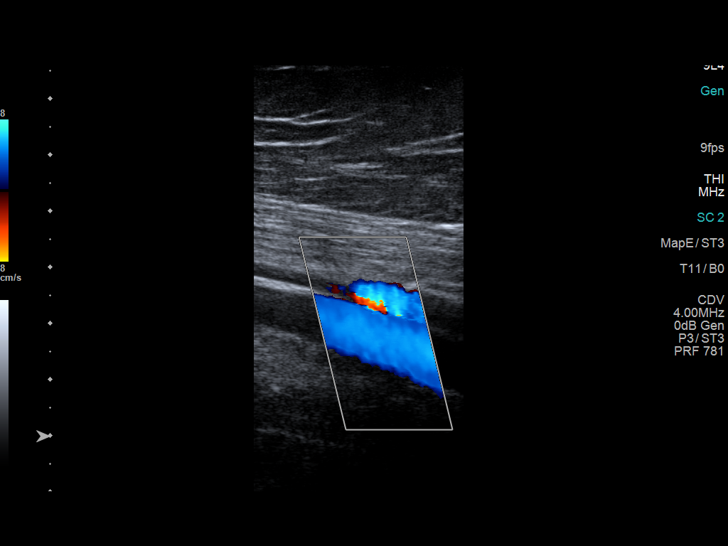
[im 28/34]
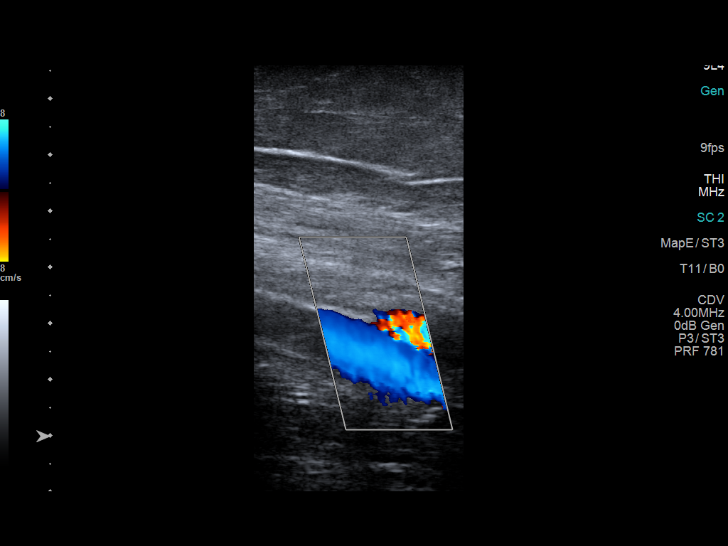
[im 31/34]
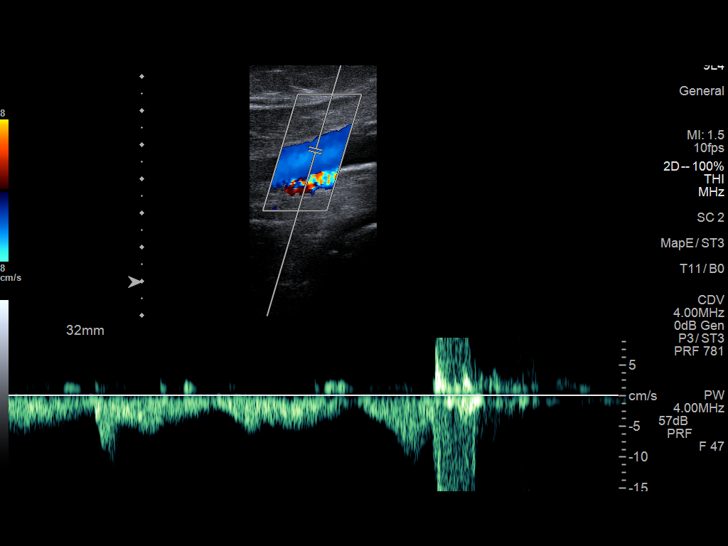
[im 34/34]
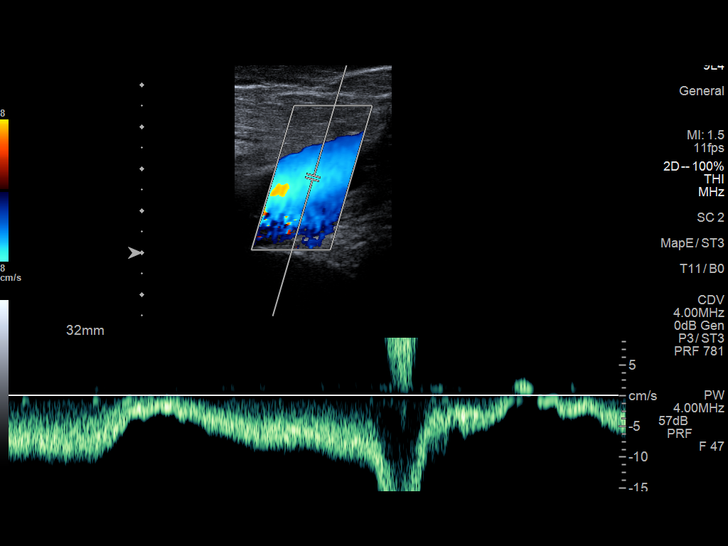

[14 of 24 positions shown; findings below may reference images not displayed]

FINDINGS: VENOUS

Normal compressibility of the LEFT common femoral, superficial
femoral, and popliteal veins, as well as the visualized calf veins.
Visualized portions of profunda femoral vein and great saphenous
vein unremarkable. No filling defects to suggest DVT on grayscale or
color Doppler imaging. Doppler waveforms show normal direction of
venous flow, normal respiratory plasticity and response to
augmentation.

Limited views of the contralateral common femoral vein are
unremarkable.

OTHER

No evidence of superficial thrombophlebitis or abnormal fluid
collection.

Limitations: none
IMPRESSION: No evidence of femoropopliteal DVT within the LEFT lower extremity.

## 2023-02-27 ENCOUNTER — Ambulatory Visit: Payer: BC Managed Care – PPO | Admitting: Nurse Practitioner

## 2023-04-03 ENCOUNTER — Ambulatory Visit: Payer: BC Managed Care – PPO | Admitting: Nurse Practitioner

## 2023-04-03 ENCOUNTER — Encounter: Payer: Self-pay | Admitting: Nurse Practitioner

## 2023-04-03 VITALS — BP 109/72 | HR 63 | Ht 66.0 in | Wt 234.8 lb

## 2023-04-03 DIAGNOSIS — F410 Panic disorder [episodic paroxysmal anxiety] without agoraphobia: Secondary | ICD-10-CM | POA: Diagnosis not present

## 2023-04-03 DIAGNOSIS — F411 Generalized anxiety disorder: Secondary | ICD-10-CM | POA: Diagnosis not present

## 2023-04-03 MED ORDER — CLONAZEPAM 1 MG PO TABS: 0.5000 mg | ORAL_TABLET | Freq: Two times a day (BID) | ORAL | 1 refills | Status: AC | PRN

## 2023-04-03 MED ORDER — CITALOPRAM HYDROBROMIDE 10 MG PO TABS: 10.0000 mg | ORAL_TABLET | Freq: Every day | ORAL | 2 refills | Status: DC

## 2023-04-03 NOTE — Progress Notes (Signed)
Established patient visit   Patient: [name] [name]   DOB: [birth year]   52 y.o. Female  MRN: [id number] Visit Date: 04/03/2023   Chief Complaint  Patient presents with   Anxiety   Subjective    HPI  Follow-up Patient states starting January 2024, has been having significant increase in workplace stress. -Describes environment is toxic, possibly violent. -Admits to feeling fearful when going to work. -Has gone through proper chain of command.  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 -Attempted to contact human resources department to report issues, but contact has not been returned. -Patient has been seeing a counselor to help manage feelings of distress. -She has applied for transfer to different school. -She is asking to set up work through the end of the school year and to transfer goes through. -She feels anxious all the time. -She is tearful. -Increase irritability. snapping at family members. -Denies feeling suicidal.  Does not wish to harm herself or anyone else.  Medications: Outpatient Medications Prior to Visit  Medication Sig   acetaminophen (TYLENOL) 500 MG tablet Take 500 mg by mouth every 6 (six) hours as needed.   Cholecalciferol (VITAMIN D3) 3000 units TABS Take 3,000 Units by mouth at bedtime.   No facility-administered medications prior to visit.    Review of Systems See HPI      Objective     Today's Vitals   04/03/23 1045  BP: 109/72  Pulse: 63  SpO2: 98%  Weight: 234 lb 12.8 oz (106.5 kg)  Height: 5\' 6"  (1.676 m)   Body mass index is 37.9 kg/m.  BP Readings from Last 3 Encounters:  04/03/23 109/72  09/25/22 102/63  08/07/22 102/62    Wt Readings from Last 3 Encounters:  04/03/23 234 lb 12.8 oz (106.5 kg)  09/25/22 229 lb 12.8 oz (104.2 kg)  08/07/22 244 lb (110.7 kg)    Physical Exam Vitals and nursing note reviewed.  Constitutional:      General: She is in acute distress.     Appearance:  Normal appearance. She is well-developed.  HENT:     Head: Normocephalic and atraumatic.     Nose: Nose normal.     Mouth/Throat:     Mouth: Mucous membranes are moist.     Pharynx: Oropharynx is clear.  Eyes:     Extraocular Movements: Extraocular movements intact.     Conjunctiva/sclera: Conjunctivae normal.     Pupils: Pupils are equal, round, and reactive to light.  Neck:     Vascular: No carotid bruit.  Cardiovascular:     Rate and Rhythm: Normal rate and regular rhythm.     Pulses: Normal pulses.     Heart sounds: Normal heart sounds.  Pulmonary:     Effort: Pulmonary effort is normal.     Breath sounds: Normal breath sounds.  Abdominal:     Palpations: Abdomen is soft.  Musculoskeletal:        General: Normal range of motion.     Cervical back: Normal range of motion and neck supple.  Lymphadenopathy:     Cervical: No cervical adenopathy.  Skin:    General: Skin is warm and dry.     Capillary Refill: Capillary refill takes less than 2 seconds.  Neurological:     General: No focal deficit present.     Mental Status: She is alert and oriented to person, place, and time.  Psychiatric:        Attention and Perception:  Attention and perception normal.        Mood and Affect: Mood is anxious and depressed. Affect is tearful.        Speech: Speech normal.        Behavior: Behavior normal. Behavior is cooperative.        Thought Content: Thought content normal.        Cognition and Memory: Cognition and memory normal.        Judgment: Judgment normal.      Assessment & Plan    Generalized anxiety disorder Assessment & Plan: Start citalopram 10 mg daily. -Recommend she continue seeing counselor to help manage feelings of distress. 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 -Any paperwork necessary and return ASAP.  Orders: -     Citalopram Hydrobromide; Take 1 tablet (10 mg total) by mouth daily.  Dispense: 30 tablet;  Refill: 2  Panic disorder Assessment & Plan: May take clonazepam 1 mg, 1/2 to 1 tablet up to twice daily as needed for acute anxiety and panic.  Orders: -     clonazePAM; Take 0.5-1 tablets (0.5-1 mg total) by mouth 2 (two) times daily as needed for anxiety.  Dispense: 30 tablet; Refill: 1     Return in about 3 weeks (around 04/24/2023) for mood. started citalopram .         Carlean Jews, NP  [facility] at [facility] [phone] (phone) [phone] (fax)  [facility]

## 2023-04-14 DIAGNOSIS — F411 Generalized anxiety disorder: Secondary | ICD-10-CM | POA: Insufficient documentation

## 2023-04-14 DIAGNOSIS — F410 Panic disorder [episodic paroxysmal anxiety] without agoraphobia: Secondary | ICD-10-CM | POA: Insufficient documentation

## 2023-04-14 NOTE — Assessment & Plan Note (Addendum)
May take clonazepam 1 mg, 1/2 to 1 tablet up to twice daily as needed for acute anxiety and panic. Reassess in 3 weeks.

## 2023-04-14 NOTE — Assessment & Plan Note (Addendum)
Start citalopram 10 mg daily. -Recommend she continue seeing counselor to help manage feelings of distress. 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 -Any paperwork necessary and return ASAP. -Reassess in 3 weeks.

## 2023-04-17 ENCOUNTER — Telehealth: Payer: Self-pay | Admitting: *Deleted

## 2023-04-17 NOTE — Telephone Encounter (Signed)
Paperwork is placed in provider basket 04/17/2023

## 2023-04-17 NOTE — Telephone Encounter (Signed)
Patient came into office to drop off paperwork for provider, she said provider was aware of the paperwork. Verbally told provider of paperwork and placed in clinical box for provider.

## 2023-04-18 ENCOUNTER — Encounter: Payer: Self-pay | Admitting: Nurse Practitioner

## 2023-04-18 ENCOUNTER — Telehealth (INDEPENDENT_AMBULATORY_CARE_PROVIDER_SITE_OTHER): Payer: BC Managed Care – PPO | Admitting: Nurse Practitioner

## 2023-04-18 VITALS — Ht 66.0 in | Wt 234.0 lb

## 2023-04-18 DIAGNOSIS — F411 Generalized anxiety disorder: Secondary | ICD-10-CM | POA: Diagnosis not present

## 2023-04-18 DIAGNOSIS — F410 Panic disorder [episodic paroxysmal anxiety] without agoraphobia: Secondary | ICD-10-CM

## 2023-04-18 NOTE — Assessment & Plan Note (Signed)
May take clonazepam 1 mg, 1/2 to 1 tablet up to twice daily as needed for acute anxiety and panic. Reassess in 2 months

## 2023-04-18 NOTE — Assessment & Plan Note (Signed)
Improved with addition of citalopram 10 mg every evening.  -She continues seeing counselor to help manage feelings of distress. -will complete short term disability paperwork and return to the patient next week.  -Reassess in 2 months.

## 2023-04-18 NOTE — Progress Notes (Signed)
Virtual Visit via Telephone Note  I connected with [name] [name] on 04/18/23 at 10:50 AM EDT by telephone and verified that I am speaking with the correct person using two identifiers.  Location: Patient: home  Provider: [place] primary care at [facility]     I discussed the limitations, risks, security and privacy concerns of performing an evaluation and management service by telephone and the availability of in person appointments. I also discussed with the patient that there may be a patient responsible charge related to this service. The patient expressed understanding and agreed to proceed.       Patient: [name] [name]   DOB: 1971/08/29   52 y.o. Female  MRN: [id number] Visit Date: 04/18/2023   Chief Complaint  Patient presents with   Medical Management of Chronic Issues   Subjective    HPI  Follow up  -GAD and panic disorder.  --started citalopram 10 mg daily  --states that she is doing better overall.  --does make her feel very tired. Wants to lay down and sleep a good deal of the time.  -taking clonazepam at night only and taking only half a tablet --is getting some random periods of anxiety which she feels mostly in her arms and hands.  --comes out of nowhere without specific cause.  She has no new concerns or complaints.  -has brought paperwork with her to be completed and returned next week in order for her to file short term disability for this concern.  -She denies chest pain, chest pressure, or shortness of breath. She denies headaches or visual disturbances. She denies abdominal pain, nausea, vomiting, or changes in bowel or bladder habits.     Medications: Outpatient Medications Prior to Visit  Medication Sig   acetaminophen (TYLENOL) 500 MG tablet Take 500 mg by mouth every 6 (six) hours as needed.   Cholecalciferol (VITAMIN D3) 3000 units TABS Take 3,000 Units by mouth at bedtime.   citalopram (CELEXA) 10 MG tablet Take 1 tablet (10 mg total) by mouth daily.    clonazePAM (KLONOPIN) 1 MG tablet Take 0.5-1 tablets (0.5-1 mg total) by mouth 2 (two) times daily as needed for anxiety.   No facility-administered medications prior to visit.    Review of Systems See HPI      Objective     Today's Vitals   04/18/23 1046  Weight: 234 lb (106.1 kg)  Height: 5\' 6"  (1.676 m)   Body mass index is 37.77 kg/m.  BP Readings from Last 3 Encounters:  04/03/23 109/72  09/25/22 102/63  08/07/22 102/62    Wt Readings from Last 3 Encounters:  04/18/23 234 lb (106.1 kg)  04/03/23 234 lb 12.8 oz (106.5 kg)  09/25/22 229 lb 12.8 oz (104.2 kg)    Physical Exam   The patient is alert and oriented. She is pleasant and answers all questions appropriately. Breathing is non-labored. She is in no acute distress at this time.      Assessment & Plan    Generalized anxiety disorder Assessment & Plan: Improved with addition of citalopram 10 mg every evening.  -She continues seeing counselor to help manage feelings of distress. -will complete short term disability paperwork and return to the patient next week.  -Reassess in 2 months.    Panic disorder Assessment & Plan: May take clonazepam 1 mg, 1/2 to 1 tablet up to twice daily as needed for acute anxiety and panic. Reassess in 2 months       Return in about  2 months (around 06/18/2023) for mood.      I discussed the assessment and treatment plan with the patient. The patient was provided an opportunity to ask questions and all were answered. The patient agreed with the plan and demonstrated an understanding of the instructions.   The patient was advised to call back or seek an in-person evaluation if the symptoms worsen or if the condition fails to improve as anticipated.  I provided 10 minutes of non-face-to-face time during this encounter.   Carlean Jews, NPEstablished patient visit   Carlean Jews, NP  [facility] at [facility] [phone] (phone) [phone]  (fax)  [facility]

## 2023-04-23 NOTE — Telephone Encounter (Signed)
Tab pt husband paid the 29.00 fee.  If there is a fax number we can fax it but they want to come pick up a copy.

## 2023-04-23 NOTE — Telephone Encounter (Signed)
Tab have been notified that paperwork is complete and copy is ready for pick up.   [employer] [place] forms were fax to [phone] FMLA forms were faxed to [phone]

## 2023-04-29 ENCOUNTER — Encounter: Payer: Self-pay | Admitting: Neurology

## 2023-04-29 ENCOUNTER — Ambulatory Visit: Payer: BC Managed Care – PPO | Admitting: Neurology

## 2023-04-29 VITALS — BP 105/64 | HR 56 | Ht 66.0 in | Wt 235.0 lb

## 2023-04-29 DIAGNOSIS — F430 Acute stress reaction: Secondary | ICD-10-CM

## 2023-04-29 DIAGNOSIS — F411 Generalized anxiety disorder: Secondary | ICD-10-CM | POA: Insufficient documentation

## 2023-04-29 DIAGNOSIS — G4719 Other hypersomnia: Secondary | ICD-10-CM | POA: Diagnosis not present

## 2023-04-29 DIAGNOSIS — R635 Abnormal weight gain: Secondary | ICD-10-CM | POA: Diagnosis not present

## 2023-04-29 DIAGNOSIS — R0683 Snoring: Secondary | ICD-10-CM | POA: Insufficient documentation

## 2023-04-29 NOTE — Progress Notes (Signed)
SLEEP MEDICINE CLINIC    Provider:  Melvyn Novas, MD  Primary Care Physician:  Carlean Jews, NP [address] [place] [address]     Referring Provider: Carlean Jews, Np [address] [place],  [place] [address]          Chief Complaint according to patient   Patient presents with:     New Patient (Initial Visit)     NEW SLEEP CONSULT Patient in room #1 with her young grandson, Durene Cal. Patient states she snores loudly, stops breathing at night and feels not rested. I sleep through a movie, and can't finish a TV show. My Husband and grandson have been bothered by breath-holding and loud snoring.       HISTORY OF PRESENT ILLNESS:  [name] [name] is a 52 y.o. female patient who is seen upon referral on 04/29/2023 from PCP  [facility] for a Sleep consult. .  Chief concern according to patient :  see above   I have the pleasure of seeing [name] [name] 04/29/23 a right-handed female with a possible sleep disorder.  She reports stress related weight gain, and had multiple surgeries: 2 total knees, preventive bilateral mastectomies, and it has been difficult to lose weight again and to maintain it. [place] raid of driving there, due to sleepiness. .    Sleep relevant medical history: Nocturia 3 times each night,  no ENT surgeries, no thyroid disease,    Family medical /sleep history: breast cancer in her mother, she herself was genetically predestined.  Father was on CPAP with OSA, had CVA, atrial fib and CHF- CAD.  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  and lives in a household with spouse and grandsons.  She has a daughter living close by. The patient currently works as a Surveyor, mining.  Tobacco use: none .  ETOH use ; none,  Caffeine intake in form of  Coffee( 2  in AM ) Soda( /) Tea ( rarely) no energy drinks Exercise in form of walking.  Gardening.  Too fatigued to exercise.   Sleep habits are as follows: The patient's dinner time is between 6-7 PM. The patient goes to bed at 9 PM and continues to sleep for 7-8 hours, wakes for many bathroom breaks,bedroom is cool, quiet and dark.  The preferred sleep position is left laterally ( mastectomy) , with the support of 1 pillow. Dreams are reportedly frequent/vivid.   The patient wakes up spontaneously without an alarm. 5.30  AM is the usual rise time. She reports not feeling refreshed or restored in AM, with symptoms such as  morning headaches, and residual fatigue. Naps are taken frequently, not scheduled,  lasting from 10 to 20 minutes and are more refreshing .    Review of Systems: Out of a complete 14 system review, the patient complains of only the following symptoms, and all other reviewed systems are negative.:  Fatigue, sleepiness , snoring, fragmented sleep by Nocturia , by choking.    How likely are you to doze in the following situations: 0 = not likely, 1 = slight chance, 2 = moderate chance, 3 = high chance   Sitting and Reading? Watching Television? Sitting inactive  in a public place (theater or meeting)? As a passenger in a car for an hour without a break? Lying down in the afternoon when circumstances permit? Sitting and talking to someone? Sitting quietly after lunch without alcohol? In a car, while stopped for a few minutes in traffic?   Total = 15/ 24 points   FSS endorsed at 40/ 63 points.   Social History   Socioeconomic History   Marital status: Married    Spouse name: Not on file   Number of children: 2   Years of education: Not on file   Highest education level: Not on file  Occupational History   Not on file  Tobacco Use   Smoking status: Never   Smokeless tobacco: Never  Vaping Use   Vaping Use: Never used  Substance and Sexual Activity   Alcohol  use: Yes    Comment: rarely   Drug use: No   Sexual activity: Yes    Comment: husband had vasectomy  Other Topics Concern   Not on file  Social History Narrative   Not on file   Social Determinants of Health   Financial Resource Strain: Not on file  Food Insecurity: Not on file  Transportation Needs: Not on file  Physical Activity: Not on file  Stress: Not on file  Social Connections: Not on file    Family History  Problem Relation Age of Onset   Colon cancer Maternal Grandmother    Breast cancer Mother        10/11 maternal aunts had breast cancer   Heart disease Father        MI at age 14   Stroke Father    Colon cancer Maternal Uncle    Heart disease Paternal Uncle        MI at age 48   Esophageal cancer Neg Hx    Stomach cancer Neg Hx    Rectal cancer Neg Hx     [name] woke up during knee surgery in 2017   Constipation    uses suppositories prn   Family history of breast cancer    10/11 maternal aunts with breast cancer. Patient has undergone prophylactic b/l mastectomy   Fatigue    Juice plus and testosterone helps    Past Surgical History:  Procedure Laterality Date   BREAST RECONSTRUCTION Bilateral 02/05/2018   Performed: BILATERAL Skin SPARING MASTECTOMIES (Bilateral Breast)   BREAST RECONSTRUCTION WITH PLACEMENT OF TISSUE EXPANDER AND FLEX HD (ACELLULAR HYDRATED DERMIS) Bilateral 02/05/2018   Procedure: BILATERAL BREAST RECONSTRUCTION WITH PLACEMENT OF TISSUE EXPANDER AND FLEX HD (ACELLULAR HYDRATED DERMIS);  Surgeon: Etter Sjogren, MD;  Location: [facility] OR;  Service: Plastics;  Laterality: Bilateral;   COLONOSCOPY     NIPPLE SPARING MASTECTOMY Bilateral 02/05/2018   Procedure: BILATERAL Skin SPARING MASTECTOMIES;  Surgeon: Griselda Miner, MD;  Location: [facility] OR;  Service: General;  Laterality: Bilateral;   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   torn meniscus  2015   left leg/ right  knee in 2016   WISDOM TOOTH EXTRACTION     in mid 20's     Current Outpatient Medications on File Prior to Visit  Medication Sig Dispense Refill   acetaminophen (TYLENOL) 500 MG tablet Take 500 mg by mouth every 6 (six) hours as needed.     Cholecalciferol (VITAMIN D3) 3000 units TABS Take 3,000 Units by mouth at bedtime.  citalopram (CELEXA) 10 MG tablet Take 1 tablet (10 mg total) by mouth daily. 30 tablet 2   clonazePAM (KLONOPIN) 1 MG tablet Take 0.5-1 tablets (0.5-1 mg total) by mouth 2 (two) times daily as needed for anxiety. 30 tablet 1   Lysine 1000 MG TABS Take 100 mg by mouth every other day.     Magnesium 125 MG CAPS Take 120 mg by mouth daily.     Vitamin C 250 MG TABS Take 350 mg by mouth every other day.     No current facility-administered medications on file prior to visit.    Allergies  Allergen Reactions   Oxycodone Nausea And Vomiting and Other (See Comments)    Causes "BAD" feeling, nausea, paranoid     DIAGNOSTIC DATA (LABS, IMAGING, TESTING) - I reviewed patient records, labs, notes, testing and imaging myself where available.  Lab Results  Component Value Date   WBC 7.5 07/02/2021   HGB 12.6 07/02/2021   HCT 40 07/02/2021   MCV 86.2 01/28/2018   PLT 398 07/02/2021      Component Value Date/Time   NA 136 01/28/2018 1045   K 3.7 01/28/2018 1045   CL 105 01/28/2018 1045   CO2 31 (A) 06/13/2021 0000   GLUCOSE 98 01/28/2018 1045   BUN 16 06/13/2021 0000   CREATININE 0.9 06/13/2021 0000   CREATININE 0.87 01/28/2018 1045   CALCIUM 9.5 06/13/2021 0000   ALBUMIN 4.2 06/13/2021 0000   AST 14 06/13/2021 0000   ALT 11 06/13/2021 0000   ALKPHOS 72 06/13/2021 0000   GFRNONAA >60 01/28/2018 1045   GFRAA >60 01/28/2018 1045   No results found for: "CHOL", "HDL", "LDLCALC", "LDLDIRECT", "TRIG", "CHOLHDL" Lab Results  Component Value Date   HGBA1C 5.3 11/15/2021   Lab Results  Component Value Date   VITAMINB12 273 06/13/2021   Lab Results   Component Value Date   TSH 2.11 06/13/2021    PHYSICAL EXAM:  Today's Vitals   04/29/23 0856  BP: 105/64  Pulse: (!) 56  Weight: 235 lb (106.6 kg)  Height: 5\' 6"  (1.676 m)   Body mass index is 37.93 kg/m.   Wt Readings from Last 3 Encounters:  04/29/23 235 lb (106.6 kg)  04/18/23 234 lb (106.1 kg)  04/03/23 234 lb 12.8 oz (106.5 kg)     Ht Readings from Last 3 Encounters:  04/29/23 5\' 6"  (1.676 m)  04/18/23 5\' 6"  (1.676 m)  04/03/23 5\' 6"  (1.676 m)      General: The patient is awake, alert and appears not in acute distress. The patient is well groomed. Head: Normocephalic, atraumatic. Neck is supple. Mallampati 3 plus ,  neck circumference:15.5 inches . Nasal airflow  patent.  Retrognathia is not seen.  Dental status: biologic , formerly treated with braces, retainers.  Cardiovascular:  Regular rate and cardiac rhythm by pulse,  without distended neck veins. Respiratory: Lungs are clear to auscultation.  Skin:  Without evidence of ankle edema, or rash. Trunk: The patient's posture is erect.   NEUROLOGIC EXAM: The patient is awake and alert, oriented to place and time.   Memory subjective described as intact.  Attention span & concentration ability appears normal.  Speech is fluent,  with dysphonia.  Mood and affect are appropriate.   Cranial nerves: no loss of smell or taste reported  Pupils are equal and briskly reactive to light. Funduscopic exam deferred.  Extraocular movements in vertical and horizontal planes were intact and without nystagmus. No Diplopia. Visual fields  by finger perimetry are intact. Hearing was intact to soft voice and finger rubbing.    Facial sensation intact to fine touch.  Facial motor strength is symmetric and tongue is in  midline.  Neck ROM : rotation, tilt and flexion extension were normal for age and shoulder shrug was symmetrical.    Motor exam:  Symmetric bulk, tone and ROM.   Normal tone without cog wheeling, symmetric grip  strength .   Sensory:  Fine touch and vibration were tested  and  normal.  Proprioception tested in the upper extremities was normal.   Coordination: Rapid alternating movements in the fingers/hands were of normal speed.    Gait and station: Patient could rise unassisted from a seated position, walked without assistive device.  Stance is of normal width/ base and the patient turned with 3 steps.- observation.   Toe and heel walk were deferred.  Deep tendon reflexes: in the  upper and lower extremities are symmetric and intact.  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  She has multiple physiological risk factors for OSA.  BMI, Neck size and high grade a Mallampati.   2)  Observed snoring, witnessed apneas per family.    3) psychological stressors. Became tearful and anxious. I agree with counseling. I also didn't discourage klonopin as medication.   Plan is to perform HS -testing while the patient is on summer break, if apnea is confirmed will treat with CPAP and  offer modafinil for safety reasons in her job.   I plan to follow up either personally or through our NP within 3-4 months.   I would like to thank Carlean Jews, NP and Carlean Jews, Np [address] [place],  [place] [address] for allowing me to meet with and to take care of this pleasant patient.   After spending a total time of  45  minutes face to face and additional time for physical and neurologic examination, review of laboratory studies,  personal review of imaging studies, reports and results of other testing and review of referral information / records as far as provided in visit,   Electronically signed by: Melvyn Novas, MD 04/29/2023 9:13 AM  [facility] Board certified by The [employer] of Sleep Medicine and Diplomate of  the [employer] of Sleep Medicine. Board certified In Neurology through the ABPN, Fellow of the [employer] of Neu[employer].

## 2023-04-29 NOTE — Patient Instructions (Signed)
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  She has multiple physiological risk factors for OSA.  BMI, Neck size and high grade a Mallampati.    2)  Observed snoring, witnessed apneas per family.     3) psychological stressors. Became tearful and anxious. I agree with counseling. I also didn't discourage klonopin as medication.    Plan is to perform HS -testing while the patient is on summer break, if apnea is confirmed will treat with CPAP and  offer modafinil for safety reasons in her job.    I plan to follow up either personally or through our NP within 3-4 months.

## 2023-05-14 ENCOUNTER — Ambulatory Visit: Payer: BC Managed Care – PPO | Admitting: Neurology

## 2023-05-14 ENCOUNTER — Telehealth: Payer: Self-pay | Admitting: *Deleted

## 2023-05-14 ENCOUNTER — Encounter: Payer: Self-pay | Admitting: Family Medicine

## 2023-05-14 DIAGNOSIS — G4733 Obstructive sleep apnea (adult) (pediatric): Secondary | ICD-10-CM

## 2023-05-14 DIAGNOSIS — F411 Generalized anxiety disorder: Secondary | ICD-10-CM

## 2023-05-14 DIAGNOSIS — R635 Abnormal weight gain: Secondary | ICD-10-CM

## 2023-05-14 DIAGNOSIS — R0683 Snoring: Secondary | ICD-10-CM

## 2023-05-14 DIAGNOSIS — G4719 Other hypersomnia: Secondary | ICD-10-CM | POA: Diagnosis not present

## 2023-05-14 NOTE — Telephone Encounter (Signed)
Pt calling stating that heather had completed some paperwork for her to be out of work and that she could go back on 05/12/23.  Pt is needing official letter stating that she is able to go back to work with no restrictions.  She would like to be able to get this from her mychart due to being out of town.

## 2023-05-14 NOTE — Telephone Encounter (Signed)
Letter sent to patient via mychart.  

## 2023-05-19 ENCOUNTER — Telehealth: Payer: Self-pay | Admitting: Neurology

## 2023-05-19 NOTE — Telephone Encounter (Signed)
Sent mychart message

## 2023-05-19 NOTE — Progress Notes (Signed)
[facility]   HOME SLEEP TEST REPORT ( by Watch PAT)   STUDY DATE:  05-19-2023  [name] [name] 52 year old female [birth year]   ORDERING CLINICIAN: Melvyn Novas, MD  REFERRING CLINICIAN:    CLINICAL INFORMATION/HISTORY: This patient was referred on 04-29-2023 by Vincent Gros nurse practitioner for evaluation of excessive daytime sleepiness. 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  She also mentioned that she cannot sleep through a movie she cannot finish any TV show without falling asleep.    Epworth sleepiness score: 15/24.  FSS at 40/ 63 points.    BMI: 38 kg/m   Neck Circumference: 15.5   FINDINGS:   Sleep Summary:   Total Recording Time (hours, min):     8 hours 21 minutes   Total Sleep Time (hours, min):   7 hours 30 minutes              Percent REM (%):    32%                                    Respiratory Indices:   Calculated pAHI (per hour):   13.9/h                          REM pAHI:      32.2/h                                           NREM pAHI:     5.3/h                         Supine AHI:    Positional independent apnea as supine AHI was 13.4/h right lateral 13.3/h and left lateral AHI 17.2/h.  No prone sleep was recorded.  Snoring statistics show a mean volume of 40 dB which is just the threshold and snoring was present for about 17% of total sleep time, or 76 minutes in total.                                               Oxygen Saturation Statistics:   O2 Saturation Range (%):     Between a nadir at 81 and a maximum saturation of 99% with a mean saturation of 94%                                  O2 Saturation (minutes) <89%:   3.7 minutes        Pulse Rate Statistics:   Pulse Mean (bpm):     62 bpm            Pulse Range:     Between 50 and 125  bpm            IMPRESSION:  This HST confirms the presence of very strongly REM sleep dependent mild  to moderate obstructive sleep apnea without any evidence of central sleep apnea being present.  This was scored by AASM criteria.  REM sleep dependent apnea is usually best treated with positive airway pressure therapy and cannot be addressed with a dental device or a hypoglossal nerve stimulator implant.   RECOMMENDATION: Autotitration CPAP between 5 and 15 cmH2O pressure 2 cm EPR, heated humidification and interface of patient's choice.  The interface should be fitted while the patient is reclined looking at the ceiling. Revisit after 30 days on CPAP therapy and before 89 days of therapy.   PS :Any trouble with the device, its fitting humidity setting etc. in the first 30 days have to be directed to the durable medical equipment company itself.    INTERPRETING PHYSICIAN:   Melvyn Novas, MD

## 2023-05-26 ENCOUNTER — Telehealth: Payer: Self-pay | Admitting: Neurology

## 2023-05-26 NOTE — Telephone Encounter (Signed)
Pt stated she is following up on Sleep Study results.

## 2023-05-26 NOTE — Telephone Encounter (Signed)
We have the results but Dr Vickey Huger has to review the official report. She was out last Thursday through today. She returns tomorrow. I will let her know that the patient is calling asking about the results and once she reviews we will contact.

## 2023-05-27 ENCOUNTER — Encounter: Payer: Self-pay | Admitting: Family Medicine

## 2023-05-27 ENCOUNTER — Other Ambulatory Visit: Payer: Self-pay | Admitting: Family Medicine

## 2023-05-27 DIAGNOSIS — G4719 Other hypersomnia: Secondary | ICD-10-CM

## 2023-05-27 DIAGNOSIS — G4733 Obstructive sleep apnea (adult) (pediatric): Secondary | ICD-10-CM

## 2023-05-27 MED ORDER — MODAFINIL 100 MG PO TABS: ORAL_TABLET | ORAL | 1 refills | Status: DC

## 2023-05-27 NOTE — Telephone Encounter (Signed)
-----   Message from Merrill Dohmeier sent at 05/27/2023  1:12 PM EDT ----- I recommend CPAP for apnea and modafinil for sleepiness, especially as a bus driver.

## 2023-05-27 NOTE — Procedures (Signed)
[facility]   HOME SLEEP TEST REPORT ( by Watch PAT)   STUDY DATE:  05-19-2023  [name] [name] 52 year old female 1970/12/20   ORDERING CLINICIAN: Melvyn Novas, MD  REFERRING CLINICIAN:    CLINICAL INFORMATION/HISTORY: This patient was referred on 04-29-2023 by Vincent Gros nurse practitioner for evaluation of excessive daytime sleepiness. 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  She also mentioned that she cannot sleep through a movie she cannot finish any TV show without falling asleep.    Epworth sleepiness score: 15/24.  FSS at 40/ 63 points.    BMI: 38 kg/m   Neck Circumference: 15.5   FINDINGS:   Sleep Summary:   Total Recording Time (hours, min):     8 hours 21 minutes   Total Sleep Time (hours, min):   7 hours 30 minutes              Percent REM (%):    32%                                    Respiratory Indices:   Calculated pAHI (per hour):   13.9/h                          REM pAHI:      32.2/h                                           NREM pAHI:     5.3/h                         Supine AHI:    Positional independent apnea as supine AHI was 13.4/h right lateral 13.3/h and left lateral AHI 17.2/h.  No prone sleep was recorded.  Snoring statistics show a mean volume of 40 dB which is just the threshold and snoring was present for about 17% of total sleep time, or 76 minutes in total.                                               Oxygen Saturation Statistics:   O2 Saturation Range (%):     Between a nadir at 81 and a maximum saturation of 99% with a mean saturation of 94%                                  O2 Saturation (minutes) <89%:   3.7 minutes        Pulse Rate Statistics:   Pulse Mean (bpm):     62 bpm            Pulse Range:     Between 50 and 125 bpm             IMPRESSION:  This HST confirms the presence of very strongly REM sleep dependent mild to moderate  obstructive sleep apnea without any evidence of central sleep apnea being present.  This was scored by AASM criteria.  REM sleep dependent apnea is usually best treated with positive airway pressure therapy and cannot be addressed with a dental device or a hypoglossal nerve stimulator implant.   RECOMMENDATION: Autotitration CPAP between 5 and 15 cmH2O pressure 2 cm EPR, heated humidification and interface of patient's choice.  The interface should be fitted while the patient is reclined looking at the ceiling. Revisit after 30 days on CPAP therapy and before 89 days of therapy.   PS :Any trouble with the device, its fitting humidity setting etc. in the first 30 days have to be directed to the durable medical equipment company itself.    INTERPRETING PHYSICIAN:   Melvyn Novas, MD

## 2023-05-27 NOTE — Telephone Encounter (Signed)
Called patient to discuss sleep study results. No answer at this time. LVM for the patient to call back.   

## 2023-05-28 ENCOUNTER — Encounter: Payer: Self-pay | Admitting: Family Medicine

## 2023-05-28 ENCOUNTER — Ambulatory Visit: Payer: BC Managed Care – PPO | Admitting: Family Medicine

## 2023-05-28 VITALS — BP 118/84 | HR 89 | Resp 18 | Ht 66.0 in | Wt 237.0 lb

## 2023-05-28 DIAGNOSIS — G4719 Other hypersomnia: Secondary | ICD-10-CM

## 2023-05-28 DIAGNOSIS — F411 Generalized anxiety disorder: Secondary | ICD-10-CM

## 2023-05-28 MED ORDER — CITALOPRAM HYDROBROMIDE 20 MG PO TABS: 20.0000 mg | ORAL_TABLET | Freq: Every day | ORAL | 3 refills | Status: DC

## 2023-05-28 NOTE — Progress Notes (Signed)
Established Patient Office Visit  Subjective   Patient ID: [name] [name], female    DOB: 1971/09/23  Age: 52 y.o. MRN: [id number]  No chief complaint on file.   HPI [name] [name] is a 52 y.o. female presenting today for follow up of mood.  She also notes that she missed a call from neurology which she guesses was to go over her sleep study results. Mood: Patient is here to follow up for anxiety, currently managing with Celexa 10 mg. Taking medication without side effects, reports excellent compliance with treatment. Denies mood changes or SI/HI. She feels mood is improved since last visit, though she does note that she finds that Celexa seems to wear off by the evening and she has increased anxiety from around dinner until bedtime.     04/18/2023   10:44 AM 04/03/2023   10:48 AM 09/25/2022   10:02 AM  Depression screen PHQ 2/9  Decreased Interest 0 1 0  Down, Depressed, Hopeless 0 1 0  PHQ - 2 Score 0 2 0  Altered sleeping 0 1 0  Tired, decreased energy 1 1 0  Change in appetite 0 1 0  Feeling bad or failure about yourself  0 1 0  Trouble concentrating 0 1 0  Moving slowly or fidgety/restless 0 0 0  Suicidal thoughts 0 0 0  PHQ-9 Score 1 7 0  Difficult doing work/chores Not difficult at all Very difficult        04/03/2023   10:49 AM 09/25/2022   10:02 AM 08/07/2022   11:33 AM 06/27/2022    9:25 AM  GAD 7 : Generalized Anxiety Score  Nervous, Anxious, on Edge 0 0 0 0  Control/stop worrying 3 0 0 0  Worry too much - different things 3 0 0 0  Trouble relaxing 1 0 0 0  Restless 0 0 0 0  Easily annoyed or irritable 1 0 0 0  Afraid - awful might happen 1 0 0 0  Total GAD 7 Score 9 0 0 0  Anxiety Difficulty Very difficult   Not difficult at all   ROS Negative unless otherwise noted in HPI   Objective:     BP 118/84 (BP Location: Left Arm, Patient Position: Sitting, Cuff Size: Normal)   Pulse 89   Resp 18   Ht 5\' 6"  (1.676 m)   Wt 237 lb (107.5 kg)   LMP 05/12/2019   SpO2  96%   BMI 38.25 kg/m   Physical Exam Constitutional:      General: She is not in acute distress.    Appearance: Normal appearance.  HENT:     Head: Normocephalic and atraumatic.  Pulmonary:     Effort: Pulmonary effort is normal. No respiratory distress.  Musculoskeletal:     Cervical back: Normal range of motion.  Neurological:     General: No focal deficit present.     Mental Status: She is alert and oriented to person, place, and time. Mental status is at baseline.  Psychiatric:        Mood and Affect: Mood normal.        Thought Content: Thought content normal.        Judgment: Judgment normal.      Assessment & Plan:  Generalized anxiety disorder Assessment & Plan: Discussed options including increasing Celexa, moving Celexa dose to morning instead of evening, or adding buspirone as augment.  Patient would prefer to start by increasing Celexa to avoid any potential side effects from  the new medication.  Starting Celexa 20 mg daily.  If she does not like how it feels and it seems like too much, she will send a message and we will switch to Celexa 15 mg daily in solution form.  Encouraged her to also continue working with her therapist.  We discussed Klonopin ideally should not be needed every day, but it is appropriate as a bridge therapy until we find a regimen that prevents breakthrough anxiety except in rare cases.  Patient verbalized understanding and is agreeable to this plan.  Orders: -     Citalopram Hydrobromide; Take 1 tablet (20 mg total) by mouth daily.  Dispense: 30 tablet; Refill: 3  Excessive daytime sleepiness Assessment & Plan: I will contact neurology to discuss who will be providing/monitoring modafinil prescription.  I will also let them know she would still like to review her sleep study results and knows that she missed their call.     Return in about 6 weeks (around 07/09/2023) for follow-up for increasing Celexa, in person or video.    Melida Quitter, PA

## 2023-05-28 NOTE — Patient Instructions (Signed)
I would also recommend asking the pharmacist about the interaction between CoQ10 and modafinil since they are the experts!

## 2023-05-28 NOTE — Assessment & Plan Note (Signed)
Discussed options including increasing Celexa, moving Celexa dose to morning instead of evening, or adding buspirone as augment.  Patient would prefer to start by increasing Celexa to avoid any potential side effects from the new medication.  Starting Celexa 20 mg daily.  If she does not like how it feels and it seems like too much, she will send a message and we will switch to Celexa 15 mg daily in solution form.  Encouraged her to also continue working with her therapist.  We discussed Klonopin ideally should not be needed every day, but it is appropriate as a bridge therapy until we find a regimen that prevents breakthrough anxiety except in rare cases.  Patient verbalized understanding and is agreeable to this plan.

## 2023-05-28 NOTE — Assessment & Plan Note (Signed)
I will contact neurology to discuss who will be providing/monitoring modafinil prescription.  I will also let them know she would still like to review her sleep study results and knows that she missed their call.

## 2023-06-02 ENCOUNTER — Encounter: Payer: Self-pay | Admitting: Family Medicine

## 2023-06-02 NOTE — Telephone Encounter (Signed)
Pt called in and states that she will  Continue with moving forward on CPAP.  I will forward the orders to [facility]. Scheduled 9/26 at 11:30 am with Danyelle for initial cpap follow up.  Pt verbalized understanding. Pt had no questions at this time but was encouraged to call back if questions arise.

## 2023-06-10 ENCOUNTER — Telehealth: Payer: Self-pay

## 2023-06-10 NOTE — Telephone Encounter (Signed)
Patient dropped off document  Disability paper work , to be filled out by provider. Patient requested to send it back via Call Patient to pick up within 7-days. Document is located in providers tray at front office.Please advise at Mobile [phone] (mobile)

## 2023-06-16 ENCOUNTER — Ambulatory Visit: Payer: BC Managed Care – PPO | Admitting: Family Medicine

## 2023-06-16 NOTE — Telephone Encounter (Signed)
I sent her a message on MyChart with some of the information that I will need to collect from her in order to fill out the paperwork.  She can send back those answers on MyChart.

## 2023-07-07 ENCOUNTER — Telehealth: Payer: Self-pay | Admitting: *Deleted

## 2023-07-07 NOTE — Telephone Encounter (Signed)
Contacted pt to give her the below information and she said that she no longer needs this paperwork.  She said that she was told that they denied the request.   She also was inquiring about an upcoming appointment to discuss her medication increase and wanted to know if she still need to come in.  She said that everything was going really well.  Spoke provider and she said that as long as things are going well we could cancel this appointment and schedule patient for a physical in November.  Pt is scheduled for this and labs in November.  Routing to PCP as an [place]

## 2023-07-07 NOTE — Telephone Encounter (Signed)
-----   Message from Melida Quitter sent at 07/07/2023  9:10 AM EDT ----- She dropped off paperwork a little while ago for disability, but I am not able to fill it out without having more information as we did not discuss the need for her paperwork at her last visit and I am not sure exactly what it is/what timeline it is for.  Please schedule an appointment for her, you can add her on a day that I have less than 16 patients.  Alternatively, if she would rather do it over MyChart messages and be billed for a MyChart encounter we can do it that way.  If she would prefer to do MyChart, just have her send me a message.

## 2023-07-07 NOTE — Telephone Encounter (Signed)
Patient doing well on Celexa, push out follow-up an additional 2 months.  We will check in at her annual physical in November.

## 2023-07-09 ENCOUNTER — Ambulatory Visit: Payer: BC Managed Care – PPO | Admitting: Family Medicine

## 2023-08-05 NOTE — Patient Instructions (Incomplete)

## 2023-08-05 NOTE — Progress Notes (Unsigned)
PATIENT: [name] [name] DOB: 06-01-1971  REASON FOR VISIT: follow up HISTORY FROM: patient  No chief complaint on file.    HISTORY OF PRESENT ILLNESS:  08/05/23 ALL:  Grabriela [name] is a 52 y.o. female here today for follow up for OSA on CPAP. She was seen in consult with Dr Vickey Huger 04/2023. HST 05/2023 showed the "presence of very strongly REM sleep dependent mild to moderate obstructive sleep apnea without any evidence of central sleep apnea being present." AutoPAP advised. Since,     HISTORY: (copied from Dr Dohmeier's previous note)  [name] Maqueda H.[name] is a 52 y.o. female patient who is seen upon referral on 04/29/2023 from PCP  [facility] for a Sleep consult. .  Chief concern according to patient :  see above   I have the pleasure of seeing Maximina Pirozzi H. [name] 04/29/23 a right-handed female with a possible sleep disorder.  She reports stress related weight gain, and had multiple surgeries: 2 total knees, preventive bilateral mastectomies, and it has been difficult to lose weight again and to maintain it. [place] raid of driving there, due to sleepiness. .    Sleep relevant medical history: Nocturia 3 times each night,  no ENT surgeries, no thyroid disease,    Family medical /sleep history: breast cancer in her mother, she herself was genetically predestined.  Father was on CPAP with OSA, had CVA, atrial fib and CHF- CAD.  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  and lives in a household with spouse and grandsons.  She has a daughter living close by. The patient currently works as a Surveyor, mining.  Tobacco use: none .  ETOH use ; none,  Caffeine intake in form of Coffee( 2  in AM ) Soda( /) Tea ( rarely) no energy drinks Exercise in form of walking.  Gardening.  Too fatigued to exercise.     Sleep habits are as follows: The patient's dinner time is between 6-7 PM. The patient goes to bed at 9 PM and continues to sleep for 7-8 hours, wakes for many bathroom breaks,bedroom is cool, quiet and dark.  The preferred sleep position is left laterally ( mastectomy) , with the support of 1 pillow. Dreams are reportedly frequent/vivid.   The patient wakes up spontaneously without an alarm. 5.30  AM is the usual rise time. She reports not feeling refreshed or restored in AM, with symptoms such as  morning headaches, and residual fatigue. Naps are taken frequently, not scheduled,  lasting from 10 to 20 minutes and are more refreshing .   REVIEW OF SYSTEMS: Out of a complete 14 system review of symptoms, the patient complains only of the following symptoms, and all other reviewed systems are negative.  ESS:  ALLERGIES: Allergies  Allergen Reactions   Oxycodone Nausea And Vomiting and Other (See Comments)    Causes "BAD" feeling, nausea, paranoid    HOME MEDICATIONS: Outpatient Medications Prior to Visit  Medication Sig Dispense Refill   acetaminophen (TYLENOL) 500 MG tablet Take 500 mg by mouth every 6 (six) hours as needed.     Cholecalciferol (VITAMIN D3) 3000 units TABS Take 3,000 Units by mouth at bedtime.     citalopram (CELEXA) 20 MG tablet Take 1 tablet (20 mg  total) by mouth daily. 30 tablet 3   clonazePAM (KLONOPIN) 1 MG tablet Take 0.5-1 tablets (0.5-1 mg total) by mouth 2 (two) times daily as needed for anxiety. 30 tablet 1   Lysine 1000 MG TABS Take 100 mg by mouth every other day.     Magnesium 125 MG CAPS Take 120 mg by mouth daily.     modafinil (PROVIGIL) 100 MG tablet Take 1 to 2 tablets (100 to 200 mg total) by mouth daily. 60 tablet 1   Vitamin C 250 MG TABS Take 350 mg by mouth every other day.     No facility-administered medications prior to visit.    PAST MEDICAL HISTORY: [name] woke up  during knee surgery in 2017   Constipation    uses suppositories prn   Family history of breast cancer    10/11 maternal aunts with breast cancer. Patient has undergone prophylactic b/l mastectomy   Fatigue    Juice plus and testosterone helps    PAST SURGICAL HISTORY: Past Surgical History:  Procedure Laterality Date   BREAST RECONSTRUCTION Bilateral 02/05/2018   Performed: BILATERAL Skin SPARING MASTECTOMIES (Bilateral Breast)   BREAST RECONSTRUCTION WITH PLACEMENT OF TISSUE EXPANDER AND FLEX HD (ACELLULAR HYDRATED DERMIS) Bilateral 02/05/2018   Procedure: BILATERAL BREAST RECONSTRUCTION WITH PLACEMENT OF TISSUE EXPANDER AND FLEX HD (ACELLULAR HYDRATED DERMIS);  Surgeon: Etter Sjogren, MD;  Location: [facility] OR;  Service: Plastics;  Laterality: Bilateral;   COLONOSCOPY     NIPPLE SPARING MASTECTOMY Bilateral 02/05/2018   Procedure: BILATERAL Skin SPARING MASTECTOMIES;  Surgeon: Chevis Pretty III, MD;  Location: [facility] OR;  Service: General;  Laterality: Bilateral;   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   torn meniscus  2015   left leg/ right knee in 2016   WISDOM TOOTH EXTRACTION     in mid 20's    FAMILY HISTORY: Family History  Problem Relation Age of Onset   Colon cancer Maternal Grandmother    Breast cancer Mother        10/11 maternal aunts had breast cancer   Heart disease Father        MI at age 28   Stroke Father    Colon cancer Maternal Uncle    Heart disease Paternal Uncle        MI at age 68   Esophageal cancer Neg Hx    Stomach cancer Neg Hx    Rectal cancer Neg Hx     SOCIAL HISTORY: Social History   Socioeconomic History   Marital status: Married    Spouse name: Not on file   Number of children: 2   Years of education: Not on file   Highest education level: Not on file  Occupational History   Not on file  Tobacco Use   Smoking status: Never    Passive exposure: Never   Smokeless tobacco: Never  Vaping Use   Vaping status: Never Used  Substance and  Sexual Activity   Alcohol use: Yes    Comment: rarely   Drug use: No   Sexual activity: Yes    Comment: husband had vasectomy  Other Topics Concern   Not on file  Social History Narrative   Not on file   Social Determinants of Health   Financial Resource Strain: Not on file  Food Insecurity: Not on file  Transportation Needs: Not on file  Physical Activity: Not on file  Stress: Not on file  Social Connections: Not on file  Intimate Partner Violence: Not on file     PHYSICAL EXAM  There were no vitals filed for this visit. There is no height or weight on file to calculate BMI.  Generalized: Well developed, in no acute distress  Cardiology: normal rate and rhythm, no murmur noted Respiratory: clear to auscultation bilaterally  Neurological examination  Mentation: Alert oriented to time, place, history taking. Follows all commands speech and language fluent Cranial nerve II-XII: Pupils were equal round reactive to light. Extraocular movements were full, visual field were full  Motor: The motor testing reveals 5 over 5 strength of all 4 extremities. Good symmetric motor tone is noted throughout.  Gait and station: Gait is normal.    DIAGNOSTIC DATA (LABS, IMAGING, TESTING) - I reviewed patient records, labs, notes, testing and imaging myself where available.      No data to display           Lab Results  Component Value Date   WBC 7.5 07/02/2021   HGB 12.6 07/02/2021   HCT 40 07/02/2021   MCV 86.2 01/28/2018   PLT 398 07/02/2021      Component Value Date/Time   NA 136 01/28/2018 1045   K 3.7 01/28/2018 1045   CL 105 01/28/2018 1045   CO2 31 (A) 06/13/2021 0000   GLUCOSE 98 01/28/2018 1045   BUN 16 06/13/2021 0000   CREATININE 0.9 06/13/2021 0000   CREATININE 0.87 01/28/2018 1045   CALCIUM 9.5 06/13/2021 0000   ALBUMIN 4.2 06/13/2021 0000   AST 14 06/13/2021 0000   ALT 11 06/13/2021 0000   ALKPHOS 72 06/13/2021 0000   GFRNONAA >60 01/28/2018 1045    GFRAA >60 01/28/2018 1045   No results found for: "CHOL", "HDL", "LDLCALC", "LDLDIRECT", "TRIG", "CHOLHDL" Lab Results  Component Value Date   HGBA1C 5.3 11/15/2021   Lab Results  Component Value Date   VITAMINB12 273 06/13/2021   Lab Results  Component Value Date   TSH 2.11 06/13/2021     ASSESSMENT AND PLAN 52 y.o. year old female  has a past medical history of Complication of anesthesia, Constipation, Family history of breast cancer, and Fatigue. here with   No diagnosis found.    Armida Vickroy Pennacchio is doing well on CPAP therapy. Compliance report reveals ***. *** was encouraged to continue using CPAP nightly and for greater than 4 hours each night. We will update supply orders as indicated. Risks of untreated sleep apnea review and education materials provided. Healthy lifestyle habits encouraged. *** will follow up in ***, sooner if needed. *** verbalizes understanding and agreement with this plan.    No orders of the defined types were placed in this encounter.    No orders of the defined types were placed in this encounter.     Kamri Richman, FNP-C 08/05/2023, 4:05 PM [facility] [address] [place], [place] [address] [phone]

## 2023-08-07 ENCOUNTER — Ambulatory Visit: Payer: BC Managed Care – PPO | Admitting: Family Medicine

## 2023-08-07 ENCOUNTER — Encounter: Payer: Self-pay | Admitting: Family Medicine

## 2023-08-07 VITALS — BP 116/59 | HR 63 | Ht 66.0 in | Wt 238.0 lb

## 2023-08-07 DIAGNOSIS — G4733 Obstructive sleep apnea (adult) (pediatric): Secondary | ICD-10-CM | POA: Diagnosis not present

## 2023-09-09 ENCOUNTER — Other Ambulatory Visit: Payer: Self-pay

## 2023-09-09 DIAGNOSIS — Z6837 Body mass index (BMI) 37.0-37.9, adult: Secondary | ICD-10-CM

## 2023-09-09 DIAGNOSIS — F411 Generalized anxiety disorder: Secondary | ICD-10-CM

## 2023-09-16 ENCOUNTER — Other Ambulatory Visit: Payer: BC Managed Care – PPO

## 2023-09-16 DIAGNOSIS — F411 Generalized anxiety disorder: Secondary | ICD-10-CM

## 2023-09-16 DIAGNOSIS — Z6837 Body mass index (BMI) 37.0-37.9, adult: Secondary | ICD-10-CM

## 2023-09-17 LAB — CBC WITH DIFFERENTIAL/PLATELET
Basophils Absolute: 0 10*3/uL (ref 0.0–0.2)
Basos: 1 %
EOS (ABSOLUTE): 0.2 10*3/uL (ref 0.0–0.4)
Eos: 4 %
Hematocrit: 40.2 % (ref 34.0–46.6)
Hemoglobin: 12.9 g/dL (ref 11.1–15.9)
Immature Grans (Abs): 0 10*3/uL (ref 0.0–0.1)
Immature Granulocytes: 0 %
Lymphocytes Absolute: 1.4 10*3/uL (ref 0.7–3.1)
Lymphs: 23 %
MCH: 27.6 pg (ref 26.6–33.0)
MCHC: 32.1 g/dL (ref 31.5–35.7)
MCV: 86 fL (ref 79–97)
Monocytes Absolute: 0.5 10*3/uL (ref 0.1–0.9)
Monocytes: 8 %
Neutrophils Absolute: 3.8 10*3/uL (ref 1.4–7.0)
Neutrophils: 64 %
Platelets: 409 10*3/uL (ref 150–450)
RBC: 4.68 x10E6/uL (ref 3.77–5.28)
RDW: 13.5 % (ref 11.7–15.4)
WBC: 5.9 10*3/uL (ref 3.4–10.8)

## 2023-09-17 LAB — COMPREHENSIVE METABOLIC PANEL
ALT: 13 [IU]/L (ref 0–32)
AST: 13 [IU]/L (ref 0–40)
Albumin: 4.2 g/dL (ref 3.8–4.9)
Alkaline Phosphatase: 82 [IU]/L (ref 44–121)
BUN/Creatinine Ratio: 24 — ABNORMAL HIGH (ref 9–23)
BUN: 19 mg/dL (ref 6–24)
Bilirubin Total: 0.4 mg/dL (ref 0.0–1.2)
CO2: 24 mmol/L (ref 20–29)
Calcium: 9.5 mg/dL (ref 8.7–10.2)
Chloride: 103 mmol/L (ref 96–106)
Creatinine, Ser: 0.8 mg/dL (ref 0.57–1.00)
Globulin, Total: 2.4 g/dL (ref 1.5–4.5)
Glucose: 83 mg/dL (ref 70–99)
Potassium: 4.4 mmol/L (ref 3.5–5.2)
Sodium: 142 mmol/L (ref 134–144)
Total Protein: 6.6 g/dL (ref 6.0–8.5)
eGFR: 88 mL/min/{1.73_m2} (ref >59)

## 2023-09-17 LAB — HEMOGLOBIN A1C
Est. average glucose Bld gHb Est-mCnc: 114 mg/dL
Hgb A1c MFr Bld: 5.6 % (ref 4.8–5.6)

## 2023-09-17 LAB — LIPID PANEL
Chol/HDL Ratio: 3.8 ratio (ref 0.0–4.4)
Cholesterol, Total: 171 mg/dL (ref 100–199)
HDL: 45 mg/dL (ref >39)
LDL Chol Calc (NIH): 108 mg/dL — ABNORMAL HIGH (ref 0–99)
Triglycerides: 95 mg/dL (ref 0–149)
VLDL Cholesterol Cal: 18 mg/dL (ref 5–40)

## 2023-09-17 LAB — TSH: TSH: 2.76 u[IU]/mL (ref 0.450–4.500)

## 2023-09-24 ENCOUNTER — Encounter: Payer: Self-pay | Admitting: Family Medicine

## 2023-09-24 ENCOUNTER — Ambulatory Visit (INDEPENDENT_AMBULATORY_CARE_PROVIDER_SITE_OTHER): Payer: BC Managed Care – PPO | Admitting: Family Medicine

## 2023-09-24 VITALS — BP 107/71 | HR 61 | Resp 18 | Ht 66.0 in | Wt 239.0 lb

## 2023-09-24 DIAGNOSIS — K219 Gastro-esophageal reflux disease without esophagitis: Secondary | ICD-10-CM

## 2023-09-24 DIAGNOSIS — Z0000 Encounter for general adult medical examination without abnormal findings: Secondary | ICD-10-CM

## 2023-09-24 DIAGNOSIS — Z23 Encounter for immunization: Secondary | ICD-10-CM

## 2023-09-24 DIAGNOSIS — J3489 Other specified disorders of nose and nasal sinuses: Secondary | ICD-10-CM | POA: Diagnosis not present

## 2023-09-24 DIAGNOSIS — F411 Generalized anxiety disorder: Secondary | ICD-10-CM

## 2023-09-24 MED ORDER — CITALOPRAM HYDROBROMIDE 20 MG PO TABS: 20.0000 mg | ORAL_TABLET | Freq: Every day | ORAL | 3 refills | Status: AC

## 2023-09-24 MED ORDER — OMEPRAZOLE 20 MG PO CPDR: 20.0000 mg | DELAYED_RELEASE_CAPSULE | Freq: Every day | ORAL | 0 refills | Status: DC

## 2023-09-24 NOTE — Assessment & Plan Note (Signed)
Recommend to avoid disrupting scab and nose to allow lesion to fully heal.  Also referring to ENT as requested.

## 2023-09-24 NOTE — Progress Notes (Signed)
Complete physical exam  Patient: [name] [name]   DOB: 06/04/1971   52 y.o. Female  MRN: [id number]  Subjective:    Chief Complaint  Patient presents with   Annual Exam    [name] [name] is a 52 y.o. female who presents today for a complete physical exam. She reports consuming a general diet.  She generally feels fairly well. She reports sleeping well now that she is using CPAP. She does have concerns regarding acid reflux that is bothering her.  She is not currently taking any medication for it.  She also has a recurrent scab in her left nostril that she peels off at least once a week.  This has lasted for several months.  When she does peel it off, she develops a nosebleed.   Most recent fall risk assessment:    09/24/2023    9:50 AM  Fall Risk   Falls in the past year? 0  Number falls in past yr: 0  Injury with Fall? 0  Risk for fall due to : No Fall Risks  Follow up Falls evaluation completed     Most recent depression and anxiety screenings:    09/24/2023    9:50 AM 04/18/2023   10:44 AM  PHQ 2/9 Scores  PHQ - 2 Score 0 0  PHQ- 9 Score 2 1      09/24/2023    9:50 AM 04/03/2023   10:49 AM 09/25/2022   10:02 AM 08/07/2022   11:33 AM  GAD 7 : Generalized Anxiety Score  Nervous, Anxious, on Edge 0 0 0 0  Control/stop worrying 0 3 0 0  Worry too much - different things 0 3 0 0  Trouble relaxing 0 1 0 0  Restless 0 0 0 0  Easily annoyed or irritable 0 1 0 0  Afraid - awful might happen 0 1 0 0  Total GAD 7 Score 0 9 0 0  Anxiety Difficulty Not difficult at all Very difficult      Patient Active Problem List   Diagnosis Date Noted   Gastroesophageal reflux disease 09/24/2023   Sore in nostril 09/24/2023   Multifactorial weight gain 04/29/2023   Snoring 04/29/2023   Panic disorder 04/14/2023   Generalized anxiety disorder 04/14/2023   Loud snoring 09/25/2022   Excessive daytime sleepiness 09/25/2022   Primary osteoarthritis of right knee 09/25/2022   Family history  of breast cancer 08/07/2022   S/P breast reconstruction, bilateral 08/07/2022   Irritable bowel syndrome with constipation 06/27/2022   Pansinusitis 05/12/2022   Cough 05/12/2022   Lower extremity edema 04/21/2022   Restless leg syndrome 02/11/2022   Fine tremor 02/11/2022   Situational anxiety 11/15/2021   Primary osteoarthritis of left knee 11/13/2021   BMI 37.0-37.9, adult 11/13/2021   Chest pain of uncertain etiology 04/25/2019   Positive test for genetic breast cancer susceptibility marker 02/05/2018   Monoallelic mutation of CHEK2 gene in female patient 12/08/2017   POLYP, COLON 08/05/2007   HEMORRHOIDS, INTERNAL 08/05/2007    Past Surgical History:  Procedure Laterality Date   BREAST RECONSTRUCTION Bilateral 02/05/2018   Performed: BILATERAL Skin SPARING MASTECTOMIES (Bilateral Breast)   BREAST RECONSTRUCTION WITH PLACEMENT OF TISSUE EXPANDER AND FLEX HD (ACELLULAR HYDRATED DERMIS) Bilateral 02/05/2018   Procedure: BILATERAL BREAST RECONSTRUCTION WITH PLACEMENT OF TISSUE EXPANDER AND FLEX HD (ACELLULAR HYDRATED DERMIS);  Surgeon: Etter Sjogren, MD;  Location: [facility] OR;  Service: Plastics;  Laterality: Bilateral;   COLONOSCOPY     NIPPLE SPARING MASTECTOMY Bilateral 02/05/2018  Procedure: BILATERAL Skin SPARING MASTECTOMIES;  Surgeon: Griselda Miner, MD;  Location: [facility] OR;  Service: General;  Laterality: Bilateral;   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   torn meniscus  2015   left leg/ right knee in 2016   WISDOM TOOTH EXTRACTION     in mid 20's   Social History   Tobacco Use   Smoking status: Never    Passive exposure: Never   Smokeless tobacco: Never  Vaping Use   Vaping status: Never Used  Substance Use Topics   Alcohol use: Yes    Comment: rarely   Drug use: No   Family History  Problem Relation Age of Onset   Colon cancer Maternal Grandmother    Breast cancer Mother        10/11 maternal aunts had breast cancer   Heart disease Father        MI at age 43    Stroke Father    Colon cancer Maternal Uncle    Heart disease Paternal Uncle        MI at age 73   Esophageal cancer Neg Hx    Stomach cancer Neg Hx    Rectal cancer Neg Hx    Allergies  Allergen Reactions   Oxycodone Nausea And Vomiting and Other (See Comments)    Causes "BAD" feeling, nausea, paranoid     Patient Care Team: Melida Quitter, PA as PCP - General (Family Medicine)   Outpatient Medications Prior to Visit  Medication Sig   acetaminophen (TYLENOL) 500 MG tablet Take 500 mg by mouth every 6 (six) hours as needed.   Cholecalciferol (VITAMIN D3) 3000 units TABS Take 3,000 Units by mouth at bedtime.   clonazePAM (KLONOPIN) 1 MG tablet Take 0.5-1 tablets (0.5-1 mg total) by mouth 2 (two) times daily as needed for anxiety.   Lysine 1000 MG TABS Take 100 mg by mouth every other day.   Magnesium 125 MG CAPS Take 120 mg by mouth daily.   Vitamin C 250 MG TABS Take 350 mg by mouth every other day.   [DISCONTINUED] citalopram (CELEXA) 20 MG tablet Take 1 tablet (20 mg total) by mouth daily.   [DISCONTINUED] modafinil (PROVIGIL) 100 MG tablet Take 1 to 2 tablets (100 to 200 mg total) by mouth daily. (Patient not taking: Reported on 08/07/2023)   No facility-administered medications prior to visit.    Review of Systems  Constitutional:  Negative for chills, fever and malaise/fatigue.  HENT:  Positive for nosebleeds. Negative for congestion and hearing loss.   Eyes:  Negative for blurred vision and double vision.  Respiratory:  Negative for cough and shortness of breath.   Cardiovascular:  Negative for chest pain, palpitations and leg swelling.  Gastrointestinal:  Positive for heartburn. Negative for abdominal pain, constipation and diarrhea.  Genitourinary:  Negative for frequency and urgency.  Musculoskeletal:  Negative for myalgias and neck pain.  Neurological:  Negative for headaches.  Endo/Heme/Allergies:  Negative for polydipsia.  Psychiatric/Behavioral:  Negative  for depression. The patient is not nervous/anxious and does not have insomnia.       Objective:    BP 107/71 (BP Location: Left Arm, Patient Position: Sitting, Cuff Size: Normal)   Pulse 61   Resp 18   Ht 5\' 6"  (1.676 m)   Wt 239 lb (108.4 kg)   LMP 05/12/2019   SpO2 98%   BMI 38.58 kg/m    Physical Exam Constitutional:      General:  She is not in acute distress.    Appearance: Normal appearance.  HENT:     Head: Normocephalic and atraumatic.     Right Ear: Tympanic membrane, ear canal and external ear normal.     Left Ear: Tympanic membrane, ear canal and external ear normal.     Nose:     Left Nostril: Epistaxis (Coagulated blood/scab present on septum of left nare.  No active bleeding.) present.     Mouth/Throat:     Mouth: Mucous membranes are moist.     Pharynx: No oropharyngeal exudate or posterior oropharyngeal erythema.  Eyes:     Extraocular Movements: Extraocular movements intact.     Conjunctiva/sclera: Conjunctivae normal.     Pupils: Pupils are equal, round, and reactive to light.  Neck:     Thyroid: No thyroid mass, thyromegaly or thyroid tenderness.  Cardiovascular:     Rate and Rhythm: Normal rate and regular rhythm.     Heart sounds: Normal heart sounds. No murmur heard.    No friction rub. No gallop.  Pulmonary:     Effort: Pulmonary effort is normal. No respiratory distress.     Breath sounds: Normal breath sounds. No wheezing, rhonchi or rales.  Abdominal:     General: Abdomen is flat. Bowel sounds are normal. There is no distension.     Palpations: There is no mass.     Tenderness: There is no abdominal tenderness. There is no guarding.  Musculoskeletal:        General: Normal range of motion.     Cervical back: Normal range of motion and neck supple.  Lymphadenopathy:     Cervical: No cervical adenopathy.  Skin:    General: Skin is warm and dry.  Neurological:     Mental Status: She is alert and oriented to person, place, and time.      Cranial Nerves: No cranial nerve deficit.     Motor: No weakness.     Deep Tendon Reflexes: Reflexes normal.  Psychiatric:        Mood and Affect: Mood normal.       Assessment & Plan:    Routine Health Maintenance and Physical Exam  There is no immunization history for the selected administration types on file for this patient.  Health Maintenance  Topic Date Due   HIV Screening  Never done   Hepatitis C Screening  Never done   DTaP/Tdap/Td (1 - Tdap) Never done   Colonoscopy  12/25/2022   COVID-19 Vaccine (1 - 2023-24 season) 10/10/2023 (Originally 07/13/2023)   Zoster Vaccines- Shingrix (1 of 2) 12/25/2023 (Originally 08/25/2020)   INFLUENZA VACCINE  02/09/2024 (Originally 06/12/2023)   MAMMOGRAM  09/21/2024   Cervical Cancer Screening (HPV/Pap Cotest)  11/14/2027   HPV VACCINES  Aged Out    Reviewed most recent labs including CBC, CMP, lipid panel, A1C, TSH, and vitamin D. All within normal limits/stable from last check other than LDL slightly elevated 108, BUN/creatinine ratio slightly elevated 24. Planning to schedule colonoscopy for December 2024. Agreeable to tetanus booster in office today. Will collect HIV and hepatitis C screening labs at next blood draw.  Discussed health benefits of physical activity, and encouraged her to engage in regular exercise appropriate for her age and condition.  Wellness examination  Generalized anxiety disorder Assessment & Plan: PHQ-9 score 2, PHQ 2 score 0, GAD-7 score 0.  Well-controlled.  Continue Celexa 20 mg daily.  Continue working with therapist.  Will continue to monitor.  Orders: -  Citalopram Hydrobromide; Take 1 tablet (20 mg total) by mouth daily.  Dispense: 90 tablet; Refill: 3  Gastroesophageal reflux disease, unspecified whether esophagitis present Assessment & Plan: Trial omeprazole 20 mg daily for 2-3 months.  If no improvement by follow-up appointment, recommend referral to gastroenterology for EGD  evaluation.  Orders: -     Omeprazole; Take 1 capsule (20 mg total) by mouth daily.  Dispense: 90 capsule; Refill: 0  Sore in nostril Assessment & Plan: Recommend to avoid disrupting scab and nose to allow lesion to fully heal.  Also referring to ENT as requested.  Orders: -     Ambulatory referral to ENT  Need for Tdap vaccination -     Tdap vaccine greater than or equal to 7yo IM    Return in about 3 months (around 12/25/2023) for follow-up for GERD.     Melida Quitter, PA

## 2023-09-24 NOTE — Patient Instructions (Addendum)
Start taking omeprazole once daily for the next 3 months until your follow-up appointment for the acid reflux.  I have sent a referral to ENT.  Until then, try to leave the spot in your nose alone to let it heal.  Call your insurance and ask them: -Which weight loss medications does your plan cover? Wegovy Zepbound Phentermine Qsymia Contrave -What criteria must be met for insurance to cover each medication?

## 2023-09-24 NOTE — Assessment & Plan Note (Signed)
Trial omeprazole 20 mg daily for 2-3 months.  If no improvement by follow-up appointment, recommend referral to gastroenterology for EGD evaluation.

## 2023-09-24 NOTE — Assessment & Plan Note (Signed)
PHQ-9 score 2, PHQ 2 score 0, GAD-7 score 0.  Well-controlled.  Continue Celexa 20 mg daily.  Continue working with therapist.  Will continue to monitor.

## 2023-10-30 ENCOUNTER — Encounter (INDEPENDENT_AMBULATORY_CARE_PROVIDER_SITE_OTHER): Payer: Self-pay

## 2023-10-30 ENCOUNTER — Institutional Professional Consult (permissible substitution) (INDEPENDENT_AMBULATORY_CARE_PROVIDER_SITE_OTHER): Payer: BC Managed Care – PPO

## 2023-10-30 ENCOUNTER — Ambulatory Visit (INDEPENDENT_AMBULATORY_CARE_PROVIDER_SITE_OTHER): Payer: BC Managed Care – PPO | Admitting: Otolaryngology

## 2023-10-30 VITALS — Ht 66.0 in | Wt 235.0 lb

## 2023-10-30 DIAGNOSIS — R040 Epistaxis: Secondary | ICD-10-CM

## 2023-10-30 DIAGNOSIS — J3489 Other specified disorders of nose and nasal sinuses: Secondary | ICD-10-CM

## 2023-10-30 MED ORDER — AYR SALINE NASAL NA GEL: 1.0000 | Freq: Four times a day (QID) | NASAL | 5 refills | Status: AC

## 2023-10-30 MED ORDER — MUPIROCIN 2 % EX OINT: 1.0000 | TOPICAL_OINTMENT | Freq: Two times a day (BID) | CUTANEOUS | 0 refills | Status: DC

## 2023-10-30 NOTE — Progress Notes (Deleted)
Dear Dr. Jairo Ben, Here is my assessment for our mutual patient, [name] [name]. Thank you for allowing me the opportunity to care for your patient. Please do not hesitate to contact me should you have any other questions. Sincerely, Dr. Jovita Kussmaul  Otolaryngology Clinic Note Referring provider: Dr. Jairo Ben HPI:  [name] [name] is a 52 y.o. female kindly referred by Dr. Jairo Ben for evaluation of epistaxis..   H&N Surgery: *** Personal or FHx of bleeding dz or anesthesia difficulty: no ***  GLP-1: *** AP/AC: ***  Tobacco: ***. Alcohol: ***. Occupation: ***. Lives in *** with ***.  Independent Review of Additional Tests or Records:  [name] [name] (09/24/2023): Using CPAP, Acid reflux concerns; also reports recurrent left nostril "scab" that peels off once a week, lasting several months. Develops epistaxis thereafter; Rx - GERD - omeprazole, avoid nasal manipulation, ref to ENT   PMH/Meds/All/SocHx/FamHx/ROS:   [name] woke up during knee surgery in 2017   Constipation    uses suppositories prn   Family history of breast cancer    10/11 maternal aunts with breast cancer. Patient has undergone prophylactic b/l mastectomy   Fatigue    Juice plus and testosterone helps     Past Surgical History:  Procedure Laterality Date   BREAST RECONSTRUCTION Bilateral 02/05/2018   Performed: BILATERAL Skin SPARING MASTECTOMIES (Bilateral Breast)   BREAST RECONSTRUCTION WITH PLACEMENT OF TISSUE EXPANDER AND FLEX HD (ACELLULAR HYDRATED DERMIS) Bilateral 02/05/2018   Procedure: BILATERAL BREAST RECONSTRUCTION WITH PLACEMENT OF TISSUE EXPANDER AND FLEX HD (ACELLULAR HYDRATED DERMIS);  Surgeon: Etter Sjogren, MD;  Location: [facility] OR;  Service: Plastics;  Laterality: Bilateral;   COLONOSCOPY     NIPPLE SPARING MASTECTOMY Bilateral 02/05/2018   Procedure: BILATERAL Skin SPARING MASTECTOMIES;  Surgeon: Griselda Miner, MD;  Location: [facility] OR;   Service: General;  Laterality: Bilateral;   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   torn meniscus  2015   left leg/ right knee in 2016   WISDOM TOOTH EXTRACTION     in mid 20's    Family History  Problem Relation Age of Onset   Colon cancer Maternal Grandmother    Breast cancer Mother        10/11 maternal aunts had breast cancer   Heart disease Father        MI at age 80   Stroke Father    Colon cancer Maternal Uncle    Heart disease Paternal Uncle        MI at age 53   Esophageal cancer Neg Hx    Stomach cancer Neg Hx    Rectal cancer Neg Hx      Social Connections: Not on file      Current Outpatient Medications:    acetaminophen (TYLENOL) 500 MG tablet, Take 500 mg by mouth every 6 (six) hours as needed., Disp: , Rfl:    Cholecalciferol (VITAMIN D3) 3000 units TABS, Take 3,000 Units by mouth at bedtime., Disp: , Rfl:    citalopram (CELEXA) 20 MG tablet, Take 1 tablet (20 mg total) by mouth daily., Disp: 90 tablet, Rfl: 3   clonazePAM (KLONOPIN) 1 MG tablet, Take 0.5-1 tablets (0.5-1 mg total) by mouth 2 (two) times daily as needed for anxiety., Disp: 30 tablet, Rfl: 1   Lysine 1000 MG TABS, Take 100 mg by mouth every other day., Disp: , Rfl:    Magnesium 125 MG CAPS, Take 120  mg by mouth daily., Disp: , Rfl:    omeprazole (PRILOSEC) 20 MG capsule, Take 1 capsule (20 mg total) by mouth daily., Disp: 90 capsule, Rfl: 0   Vitamin C 250 MG TABS, Take 350 mg by mouth every other day., Disp: , Rfl:    Physical Exam:   LMP 05/12/2019   Salient findings:  CN II-XII intact *** Bilateral EAC clear and TM intact with well pneumatized middle ear spaces Weber 512: *** Rinne 512: AC > BC b/l *** Rine 1024: AC > BC b/l *** Anterior rhinoscopy: Septum ***; bilateral inferior turbinates with *** No lesions of oral cavity/oropharynx; dentition *** No obviously palpable neck masses/lymphadenopathy/thyromegaly No respiratory distress or stridor***  Seprately Identifiable  Procedures:  None***  Impression & Plans:  [name] [name] is a 52 y.o. female with ***  No diagnosis found.   - f/u ***  See below regarding exact medications prescribed this encounter including dosages and route: No orders of the defined types were placed in this encounter.     Thank you for allowing me the opportunity to care for your patient. Please do not hesitate to contact me should you have any other questions.  Sincerely, Jovita Kussmaul, MD Otolarynoglogist (ENT), [facility] Phone: [phone] Fax: [phone]  10/30/2023, 8:33 AM   MDM:  Level *** Complexity/Problems addressed: *** Data complexity: *** independent review of *** - Morbidity: ***  - Prescription Drug prescribed or managed: ***

## 2023-10-30 NOTE — Patient Instructions (Addendum)
Use ayr gel 2-4 times per day for 30 days - don't use it at same time as mupirocin; after you stop the mupirocin, apply it 4 times per day Use mupirocin ointment twice daily for 14 days --- Apply pea sized amount using end of finger to the nose and then pinch your nostril  Do not blow your nose or stick anything in there

## 2023-10-30 NOTE — Progress Notes (Signed)
Dear Dr. Jairo Ben, Here is my assessment for our mutual patient, [name] [name]. Thank you for allowing me the opportunity to care for your patient. Please do not hesitate to contact me should you have any other questions. Sincerely, Dr. Jovita Kussmaul  Otolaryngology Clinic Note Referring provider: Dr. Jairo Ben HPI:  [name] [name] is a 52 y.o. female kindly referred by Dr. Jairo Ben for evaluation of epistaxis and left nasal lesion Initial visit (10/2023): She reports that she has had a left nasal lesion for about 4 months. She reports that it will not heal, and scabs over. She then tries to remove the scab and then it bleeds. It has stayed the same in size, not painful. Does not know what it brought it about. No trauma to the nose, no picking or scratch. No nasal hair trimming. She has tried nasal cleaning and put vaseline on there but has not worked. She has also tried an oil but it did not work.  No other nasal medication use.  Prior sinus infection history but nothing over past few years. No pressure/pain, no hyposmia, no congestion or obstruction. No allergy testing or imaging Does use a nasal CPAP - used it only for about 6 weeks No prior CT  PMHx: GAD, GERD, OSA on CPAP  She drives a school bus  H&N Surgery: no Personal or FHx of bleeding dz or anesthesia difficulty: no but does report "woke up during knee surgery in 2017"  GLP-1: no AP/AC: no  Tobacco: no. Occupation: school bus driver. Lives in [place], [place]  Independent Review of Additional Tests or Records:  [name] [name] (09/24/2023): Using CPAP, Acid reflux concerns; also reports recurrent left nostril "scab" that peels off once a week, lasting several months. Develops epistaxis thereafter; Rx - GERD - omeprazole, avoid nasal manipulation, ref to ENT CBC 09/2023: WBC 5.9, Plt 409; CMP: wnl, Cr 0.8 No imaging  PMH/Meds/All/SocHx/FamHx/ROS:   [name] woke up during  knee surgery in 2017   Constipation    uses suppositories prn   Family history of breast cancer    10/11 maternal aunts with breast cancer. Patient has undergone prophylactic b/l mastectomy   Fatigue    Juice plus and testosterone helps     Past Surgical History:  Procedure Laterality Date   BREAST RECONSTRUCTION Bilateral 02/05/2018   Performed: BILATERAL Skin SPARING MASTECTOMIES (Bilateral Breast)   BREAST RECONSTRUCTION WITH PLACEMENT OF TISSUE EXPANDER AND FLEX HD (ACELLULAR HYDRATED DERMIS) Bilateral 02/05/2018   Procedure: BILATERAL BREAST RECONSTRUCTION WITH PLACEMENT OF TISSUE EXPANDER AND FLEX HD (ACELLULAR HYDRATED DERMIS);  Surgeon: Etter Sjogren, MD;  Location: [facility] OR;  Service: Plastics;  Laterality: Bilateral;   COLONOSCOPY     NIPPLE SPARING MASTECTOMY Bilateral 02/05/2018   Procedure: BILATERAL Skin SPARING MASTECTOMIES;  Surgeon: Griselda Miner, MD;  Location: [facility] OR;  Service: General;  Laterality: Bilateral;   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   torn meniscus  2015   left leg/ right knee in 2016   WISDOM TOOTH EXTRACTION     in mid 20's    Family History  Problem Relation Age of Onset   Colon cancer Maternal Grandmother    Breast cancer Mother        10/11 maternal aunts had breast cancer   Heart disease Father        MI at age 64   Stroke Father    Colon cancer Maternal  Uncle    Heart disease Paternal Uncle        MI at age 90   Esophageal cancer Neg Hx    Stomach cancer Neg Hx    Rectal cancer Neg Hx      Social Connections: Not on file      Current Outpatient Medications:    acetaminophen (TYLENOL) 500 MG tablet, Take 500 mg by mouth every 6 (six) hours as needed., Disp: , Rfl:    Cholecalciferol (VITAMIN D3) 3000 units TABS, Take 3,000 Units by mouth at bedtime., Disp: , Rfl:    citalopram (CELEXA) 20 MG tablet, Take 1 tablet (20 mg total) by mouth daily., Disp: 90 tablet, Rfl: 3   clonazePAM (KLONOPIN) 1 MG tablet, Take 0.5-1 tablets  (0.5-1 mg total) by mouth 2 (two) times daily as needed for anxiety., Disp: 30 tablet, Rfl: 1   Lysine 1000 MG TABS, Take 100 mg by mouth every other day., Disp: , Rfl:    Magnesium 125 MG CAPS, Take 120 mg by mouth daily., Disp: , Rfl:    mupirocin ointment (BACTROBAN) 2 %, Apply 1 Application topically 2 (two) times daily. Apply pea sized amount using end of finger to the nose and then pinch your nostril, Disp: 22 g, Rfl: 0   omeprazole (PRILOSEC) 20 MG capsule, Take 1 capsule (20 mg total) by mouth daily., Disp: 90 capsule, Rfl: 0   saline (AYR) GEL, Place 1 Application into both nostrils every 6 (six) hours., Disp: 14 g, Rfl: 5   Vitamin C 250 MG TABS, Take 350 mg by mouth every other day., Disp: , Rfl:    Physical Exam:   Ht 5\' 6"  (1.676 m)   Wt 235 lb (106.6 kg)   LMP 05/12/2019   BMI 37.93 kg/m   Salient findings:  CN II-XII intact  Bilateral EAC clear and TM intact with well pneumatized middle ear spaces Anterior rhinoscopy: Septum relatively midline; there is a <1cm area over the midline caudal septum that appears to be mucosalized rather than epithelialized (more shiny) which has crusting overlying; not papillomatous and palpation does not appear to have any submucosal mass; bilateral inferior turbinates with mild hypertrophy; Nasal endoscopy was indicated to better evaluate the nose and paranasal sinuses, given the patient's history and exam findings and to rule out any other masses more distally, and is detailed below. No lesions of oral cavity/oropharynx; no masses or telangectasia No obviously palpable neck masses/lymphadenopathy/thyromegaly No respiratory distress or stridor  Seprately Identifiable Procedures:  PROCEDURE: Bilateral Diagnostic Rigid Nasal Endoscopy Pre-procedure diagnosis: Concern for nasal lesion, epistaxis Post-procedure diagnosis: same Indication: See pre-procedure diagnosis and physical exam above Complications: None apparent EBL: 0 mL Anesthesia:  Lidocaine 4% and topical decongestant was topically sprayed in each nasal cavity  Description of Procedure:  Patient was identified. A rigid 30 degree endoscope was utilized to evaluate the sinonasal cavities, mucosa, sinus ostia and turbinates and septum.  Overall, signs of mucosal inflammation are not noted.  Also noted are as noted above, mucosalized area over caudal septum on left; looks slightly wet - there is a small vessel that is running on superior end of it which I suspect is cause of intermittent bleeding; no distal lesions.  No mucopurulence, polyps, or masses noted.   Right Middle meatus: clear Right SE Recess: clear Left MM: clear Left SE Recess: clear    Photodocumentation was obtained.  CPT CODE -- [address] - Mod 25   Impression & Plans:  [name] [name] is a  52 y.o. female with  1. Nasal lesion   2. Epistaxis    Unclear etiology but just appears as if it is a small mucosalized area; does not look particularly worrisome. She has been trying to treat it and manipulating it some, which may have prevented healing but unclear. As such, will try conservative regimen first. Consider silver nitrate if does not work? - Start mupirocin BID x14d - Ayr gel x14d - No manipulation - f/u 1 month  See below regarding exact medications prescribed this encounter including dosages and route: Meds ordered this encounter  Medications   saline (AYR) GEL    Sig: Place 1 Application into both nostrils every 6 (six) hours.    Dispense:  14 g    Refill:  5   mupirocin ointment (BACTROBAN) 2 %    Sig: Apply 1 Application topically 2 (two) times daily. Apply pea sized amount using end of finger to the nose and then pinch your nostril    Dispense:  22 g    Refill:  0      Thank you for allowing me the opportunity to care for your patient. Please do not hesitate to contact me should you have any other questions.  Sincerely, Jovita Kussmaul, MD Otolarynoglogist (ENT), [facility] Phone: [phone] Fax: [phone]  11/07/2023, 9:08 AM   MDM:  Level 3 Complexity/Problems addressed: new problem Data complexity: low - independent review of notes, labs - Morbidity: mod  - Prescription Drug prescribed or managed: yes

## 2023-11-28 ENCOUNTER — Telehealth (INDEPENDENT_AMBULATORY_CARE_PROVIDER_SITE_OTHER): Payer: Self-pay | Admitting: Otolaryngology

## 2023-11-28 NOTE — Telephone Encounter (Signed)
Reminder Call: Date: 12/01/2023 Status: Sch  Time: 10:45 AM [address] [place], [place] [address]  Left Voicemail

## 2023-12-01 ENCOUNTER — Ambulatory Visit (INDEPENDENT_AMBULATORY_CARE_PROVIDER_SITE_OTHER): Payer: 59

## 2023-12-01 NOTE — Progress Notes (Deleted)
Dear Dr. Jairo Ben, Here is my assessment for our mutual patient, [name] [name]. Thank you for allowing me the opportunity to care for your patient. Please do not hesitate to contact me should you have any other questions. Sincerely, Dr. Jovita Kussmaul  Otolaryngology Clinic Note Referring provider: Dr. Jairo Ben HPI:  [name] [name] is a 53 y.o. female kindly referred by Dr. Jairo Ben for evaluation of epistaxis and left nasal lesion Initial visit (10/2023): She reports that she has had a left nasal lesion for about 4 months. She reports that it will not heal, and scabs over. She then tries to remove the scab and then it bleeds. It has stayed the same in size, not painful. Does not know what it brought it about. No trauma to the nose, no picking or scratch. No nasal hair trimming. She has tried nasal cleaning and put vaseline on there but has not worked. She has also tried an oil but it did not work.  No other nasal medication use.  Prior sinus infection history but nothing over past few years. No pressure/pain, no hyposmia, no congestion or obstruction. No allergy testing or imaging Does use a nasal CPAP - used it only for about 6 weeks No prior CT --------------------------------------------------------- 12/01/2023 ***   PMHx: GAD, GERD, OSA on CPAP  She drives a school bus  H&N Surgery: no Personal or FHx of bleeding dz or anesthesia difficulty: no but does report "woke up during knee surgery in 2017"  GLP-1: no AP/AC: no  Tobacco: no. Occupation: school bus driver. Lives in [place], [place]  Independent Review of Additional Tests or Records:  Saralyn Pilar (09/24/2023): Using CPAP, Acid reflux concerns; also reports recurrent left nostril "scab" that peels off once a week, lasting several months. Develops epistaxis thereafter; Rx - GERD - omeprazole, avoid nasal manipulation, ref to ENT CBC 09/2023: WBC 5.9, Plt 409; CMP: wnl, Cr 0.8 No imaging  PMH/Meds/All/SocHx/FamHx/ROS:   Past Medical  History:  Diagnosis Date   Complication of anesthesia    Pt woke up during knee surgery in 2017   Constipation    uses suppositories prn   Family history of breast cancer    10/11 maternal aunts with breast cancer. Patient has undergone prophylactic b/l mastectomy   Fatigue    Juice plus and testosterone helps     Past Surgical History:  Procedure Laterality Date   BREAST RECONSTRUCTION Bilateral 02/05/2018   Performed: BILATERAL Skin SPARING MASTECTOMIES (Bilateral Breast)   BREAST RECONSTRUCTION WITH PLACEMENT OF TISSUE EXPANDER AND FLEX HD (ACELLULAR HYDRATED DERMIS) Bilateral 02/05/2018   Procedure: BILATERAL BREAST RECONSTRUCTION WITH PLACEMENT OF TISSUE EXPANDER AND FLEX HD (ACELLULAR HYDRATED DERMIS);  Surgeon: Etter Sjogren, MD;  Location: [facility] 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   torn meniscus  2015   left leg/ right knee in 2016   WISDOM TOOTH EXTRACTION     in mid 20's    Family History  Problem Relation Age of Onset   Colon cancer Maternal Grandmother    Breast cancer Mother        10/11 maternal aunts had breast cancer   Heart disease Father        MI at age 60   Stroke Father  Colon cancer Maternal Uncle    Heart disease Paternal Uncle        MI at age 22   Esophageal cancer Neg Hx    Stomach cancer Neg Hx    Rectal cancer Neg Hx      Social Connections: Not on file      Current Outpatient Medications:    acetaminophen (TYLENOL) 500 MG tablet, Take 500 mg by mouth every 6 (six) hours as needed., Disp: , Rfl:    Cholecalciferol (VITAMIN D3) 3000 units TABS, Take 3,000 Units by mouth at bedtime., Disp: , Rfl:    citalopram (CELEXA) 20 MG tablet, Take 1 tablet (20 mg total) by mouth daily., Disp:  90 tablet, Rfl: 3   clonazePAM (KLONOPIN) 1 MG tablet, Take 0.5-1 tablets (0.5-1 mg total) by mouth 2 (two) times daily as needed for anxiety., Disp: 30 tablet, Rfl: 1   Lysine 1000 MG TABS, Take 100 mg by mouth every other day., Disp: , Rfl:    Magnesium 125 MG CAPS, Take 120 mg by mouth daily., Disp: , Rfl:    mupirocin ointment (BACTROBAN) 2 %, Apply 1 Application topically 2 (two) times daily. Apply pea sized amount using end of finger to the nose and then pinch your nostril, Disp: 22 g, Rfl: 0   omeprazole (PRILOSEC) 20 MG capsule, Take 1 capsule (20 mg total) by mouth daily., Disp: 90 capsule, Rfl: 0   saline (AYR) GEL, Place 1 Application into both nostrils every 6 (six) hours., Disp: 14 g, Rfl: 5   Vitamin C 250 MG TABS, Take 350 mg by mouth every other day., Disp: , Rfl:    Physical Exam:   LMP 05/12/2019   Salient findings:  CN II-XII intact  Bilateral EAC clear and TM intact with well pneumatized middle ear spaces Anterior rhinoscopy: Septum relatively midline; there is a <1cm area over the midline caudal septum that appears to be mucosalized rather than epithelialized (more shiny) which has crusting overlying; not papillomatous and palpation does not appear to have any submucosal mass; bilateral inferior turbinates with mild hypertrophy; Nasal endoscopy was indicated to better evaluate the nose and paranasal sinuses, given the patient's history and exam findings and to rule out any other masses more distally, and is detailed below. No lesions of oral cavity/oropharynx; no masses or telangectasia No obviously palpable neck masses/lymphadenopathy/thyromegaly No respiratory distress or stridor  Seprately Identifiable Procedures:  PROCEDURE: Bilateral Diagnostic Rigid Nasal Endoscopy Pre-procedure diagnosis: Concern for nasal lesion, epistaxis Post-procedure diagnosis: same Indication: See pre-procedure diagnosis and physical exam above Complications: None apparent EBL: 0  mL Anesthesia: Lidocaine 4% and topical decongestant was topically sprayed in each nasal cavity  Description of Procedure:  Patient was identified. A rigid 30 degree endoscope was utilized to evaluate the sinonasal cavities, mucosa, sinus ostia and turbinates and septum.  Overall, signs of mucosal inflammation are not noted.  Also noted are as noted above, mucosalized area over caudal septum on left; looks slightly wet - there is a small vessel that is running on superior end of it which I suspect is cause of intermittent bleeding; no distal lesions.  No mucopurulence, polyps, or masses noted.   Right Middle meatus: clear Right SE Recess: clear Left MM: clear Left SE Recess: clear    Photodocumentation was obtained.  CPT CODE -- 41324 - Mod 25   Impression & Plans:  Kendra Mathiowetz is a 53 y.o. female with  No diagnosis found.  Unclear etiology but just appears as if  it is a small mucosalized area; does not look particularly worrisome. She has been trying to treat it and manipulating it some, which may have prevented healing but unclear. As such, will try conservative regimen first. Consider silver nitrate if does not work? - Start mupirocin BID x14d - Ayr gel x14d - No manipulation - f/u 1 month  See below regarding exact medications prescribed this encounter including dosages and route: No orders of the defined types were placed in this encounter.     Thank you for allowing me the opportunity to care for your patient. Please do not hesitate to contact me should you have any other questions.  Sincerely, Jovita Kussmaul, MD Otolarynoglogist (ENT), [facility] Phone: [phone] Fax: [phone]  12/01/2023, 8:06 AM   MDM:  Level 3 Complexity/Problems addressed: new problem Data complexity: low - independent review of notes, labs - Morbidity: mod  - Prescription Drug prescribed or managed: yes

## 2023-12-04 ENCOUNTER — Encounter: Payer: Self-pay | Admitting: Gastroenterology

## 2023-12-05 ENCOUNTER — Encounter: Payer: Self-pay | Admitting: Internal Medicine

## 2023-12-18 ENCOUNTER — Encounter: Payer: Self-pay | Admitting: Family Medicine

## 2023-12-24 ENCOUNTER — Other Ambulatory Visit (HOSPITAL_BASED_OUTPATIENT_CLINIC_OR_DEPARTMENT_OTHER): Payer: Self-pay | Admitting: Obstetrics and Gynecology

## 2023-12-24 ENCOUNTER — Ambulatory Visit: Payer: BC Managed Care – PPO | Admitting: Family Medicine

## 2023-12-24 DIAGNOSIS — Z8249 Family history of ischemic heart disease and other diseases of the circulatory system: Secondary | ICD-10-CM

## 2023-12-26 ENCOUNTER — Ambulatory Visit (HOSPITAL_BASED_OUTPATIENT_CLINIC_OR_DEPARTMENT_OTHER)
Admission: RE | Admit: 2023-12-26 | Discharge: 2023-12-26 | Disposition: A | Discharge status: Home / Self Care | Payer: Self-pay | Source: Ambulatory Visit | Attending: Obstetrics and Gynecology | Admitting: Obstetrics and Gynecology

## 2023-12-26 DIAGNOSIS — Z8249 Family history of ischemic heart disease and other diseases of the circulatory system: Secondary | ICD-10-CM

## 2024-01-02 ENCOUNTER — Encounter: Payer: 59 | Admitting: Gastroenterology

## 2024-01-07 ENCOUNTER — Ambulatory Visit (AMBULATORY_SURGERY_CENTER): Payer: 59

## 2024-01-07 VITALS — Ht 66.0 in | Wt 236.0 lb

## 2024-01-07 DIAGNOSIS — Z800 Family history of malignant neoplasm of digestive organs: Secondary | ICD-10-CM

## 2024-01-07 DIAGNOSIS — Z860100 Personal history of colon polyps, unspecified: Secondary | ICD-10-CM

## 2024-01-07 MED ORDER — SUFLAVE 178.7 G PO SOLR: 1.0000 | ORAL | 0 refills | Status: DC

## 2024-01-07 NOTE — Progress Notes (Signed)
 No egg or soy allergy known to patient  No issues known to pt with past sedation with any surgeries or procedures Patient denies ever being told they had issues or difficulty with intubation  No FH of Malignant Hyperthermia Pt is not on diet pills Pt is not on  home 02  OSA uses CPAP  Pt is not on blood thinners  Pt reports constipation taking OTC bitaleral lax at bedtime as needed  No A fib or A flutter Have any cardiac testing pending-- no  LOA: independent  Prep: suflave   Patient's chart reviewed by Cathlyn Parsons CNRA prior to previsit and patient appropriate for the [facility].  Previsit completed and red dot placed by patient's name on their procedure day (on provider's schedule).     PV completed with patient. Prep instructions sent via mychart and home address.

## 2024-01-16 ENCOUNTER — Encounter: Payer: Self-pay | Admitting: Internal Medicine

## 2024-01-22 ENCOUNTER — Telehealth: Payer: Self-pay | Admitting: Internal Medicine

## 2024-01-22 DIAGNOSIS — Z860100 Personal history of colon polyps, unspecified: Secondary | ICD-10-CM

## 2024-01-22 DIAGNOSIS — Z800 Family history of malignant neoplasm of digestive organs: Secondary | ICD-10-CM

## 2024-01-22 MED ORDER — NA SULFATE-K SULFATE-MG SULF 17.5-3.13-1.6 GM/177ML PO SOLN: 1.0000 | Freq: Once | ORAL | 0 refills | Status: AC

## 2024-01-22 NOTE — Telephone Encounter (Signed)
 Attempted to reach pt, no answer.  Left message on machine to call back as soon as possible

## 2024-01-22 NOTE — Telephone Encounter (Signed)
 Inbound call from patient stating [facility] advised her that insurance will not cover prep medication for tomorrow's procedure. Requesting a call to discuss if an alternative can be called into her pharmacy today. Please advise, thank you.

## 2024-01-23 ENCOUNTER — Ambulatory Visit: Payer: 59 | Admitting: Internal Medicine

## 2024-01-23 ENCOUNTER — Encounter: Payer: Self-pay | Admitting: Internal Medicine

## 2024-01-23 VITALS — BP 110/64 | HR 64 | Temp 97.9°F | Resp 17 | Ht 66.0 in | Wt 236.0 lb

## 2024-01-23 DIAGNOSIS — K6389 Other specified diseases of intestine: Secondary | ICD-10-CM

## 2024-01-23 DIAGNOSIS — Q438 Other specified congenital malformations of intestine: Secondary | ICD-10-CM | POA: Diagnosis not present

## 2024-01-23 DIAGNOSIS — Z860101 Personal history of adenomatous and serrated colon polyps: Secondary | ICD-10-CM

## 2024-01-23 DIAGNOSIS — D122 Benign neoplasm of ascending colon: Secondary | ICD-10-CM | POA: Diagnosis not present

## 2024-01-23 DIAGNOSIS — Z860100 Personal history of colon polyps, unspecified: Secondary | ICD-10-CM

## 2024-01-23 DIAGNOSIS — K648 Other hemorrhoids: Secondary | ICD-10-CM | POA: Diagnosis not present

## 2024-01-23 DIAGNOSIS — Z800 Family history of malignant neoplasm of digestive organs: Secondary | ICD-10-CM | POA: Diagnosis not present

## 2024-01-23 DIAGNOSIS — Z1211 Encounter for screening for malignant neoplasm of colon: Secondary | ICD-10-CM | POA: Diagnosis present

## 2024-01-23 MED ORDER — SODIUM CHLORIDE 0.9 % IV SOLN: 500.0000 mL | Freq: Once | INTRAVENOUS | Status: DC

## 2024-01-23 NOTE — Progress Notes (Signed)
 Sedate, gd SR, tolerated procedure well, VSS, report to RN

## 2024-01-23 NOTE — Progress Notes (Signed)
 Called to room to assist during endoscopic procedure.  Patient ID and intended procedure confirmed with present staff. Received instructions for my participation in the procedure from the performing physician.

## 2024-01-23 NOTE — Patient Instructions (Addendum)
-  Handout on polyps and hemorrhoids and hemorrhoid banding provided -await pathology results -repeat colonoscopy in 5 years for surveillance recommended. -Continue present medications    YOU HAD AN ENDOSCOPIC PROCEDURE TODAY AT THE [facility]:   Refer to the procedure report that was given to you for any specific questions about what was found during the examination.  If the procedure report does not answer your questions, please call your gastroenterologist to clarify.  If you requested that your care partner not be given the details of your procedure findings, then the procedure report has been included in a sealed envelope for you to review at your convenience later.  YOU SHOULD EXPECT: Some feelings of bloating in the abdomen. Passage of more gas than usual.  Walking can help get rid of the air that was put into your GI tract during the procedure and reduce the bloating. If you had a lower endoscopy (such as a colonoscopy or flexible sigmoidoscopy) you may notice spotting of blood in your stool or on the toilet paper. If you underwent a bowel prep for your procedure, you may not have a normal bowel movement for a few days.  Please Note:  You might notice some irritation and congestion in your nose or some drainage.  This is from the oxygen used during your procedure.  There is no need for concern and it should clear up in a day or so.  SYMPTOMS TO REPORT IMMEDIATELY:  Following lower endoscopy (colonoscopy or flexible sigmoidoscopy):  Excessive amounts of blood in the stool  Significant tenderness or worsening of abdominal pains  Swelling of the abdomen that is new, acute  Fever of 100F or higher  For urgent or emergent issues, a gastroenterologist can be reached at any hour by calling [phone]. Do not use MyChart messaging for urgent concerns.    DIET:  We do recommend a small meal at first, but then you may proceed to your regular diet.  Drink plenty of fluids but you  should avoid alcoholic beverages for 24 hours.  ACTIVITY:  You should plan to take it easy for the rest of today and you should NOT DRIVE or use heavy machinery until tomorrow (because of the sedation medicines used during the test).    FOLLOW UP: Our staff will call the number listed on your records the next business day following your procedure.  We will call around 7:15- 8:00 am to check on you and address any questions or concerns that you may have regarding the information given to you following your procedure. If we do not reach you, we will leave a message.     If any biopsies were taken you will be contacted by phone or by letter within the next 1-3 weeks.  Please call us at [phone] if you have not heard about the biopsies in 3 weeks.    SIGNATURES/CONFIDENTIALITY: You and/or your care partner have signed paperwork which will be entered into your electronic medical record.  These signatures attest to the fact that that the information above on your After Visit Summary has been reviewed and is understood.  Full responsibility of the confidentiality of this discharge information lies with you and/or your care-partner.

## 2024-01-23 NOTE — Progress Notes (Signed)
 Pt's states no medical or surgical changes since previsit or office visit.

## 2024-01-23 NOTE — Op Note (Signed)
 [facility] Patient Name: [name] [name] Procedure Date: 01/23/2024 7:42 AM MRN: [id number] Endoscopist: Wilhemina Bonito. Marina Goodell , MD, [id number] Age: 53 Referring MD:  Date of Birth: 06-Sep-1971 Gender: Female Account #: [id number] Procedure:                Colonoscopy with cold snare polypectomy x 1; with                            biopsies Indications:              High risk colon cancer surveillance: Personal                            history of sessile serrated colon polyp (less than                            10 mm in size) with no dysplasia. Family history of                            colon cancer in multiple second-degree relatives.                            Previous patient of Dr. Russella Dar. Prior examinations                            2008, 2014, 2019 Medicines:                Monitored Anesthesia Care Procedure:                Pre-Anesthesia Assessment:                           - Prior to the procedure, a History and Physical                            was performed, and patient medications and                            allergies were reviewed. The patient's tolerance of                            previous anesthesia was also reviewed. The risks                            and benefits of the procedure and the sedation                            options and risks were discussed with the patient.                            All questions were answered, and informed consent                            was obtained. Prior Anticoagulants: The patient has  taken no anticoagulant or antiplatelet agents. ASA                            Grade Assessment: II - A patient with mild systemic                            disease. After reviewing the risks and benefits,                            the patient was deemed in satisfactory condition to                            undergo the procedure.                           After obtaining informed consent, the  colonoscope                            was passed under direct vision. Throughout the                            procedure, the patient's blood pressure, pulse, and                            oxygen saturations were monitored continuously. The                            Olympus Scope [id number] was introduced through the                            anus and advanced to the the cecum, identified by                            appendiceal orifice and ileocecal valve. The                            ileocecal valve, appendiceal orifice, and rectum                            were photographed. The quality of the bowel                            preparation was excellent. The colonoscopy was                            performed with difficulty secondary to colonic                            redundancy. The patient tolerated the procedure                            well. The bowel preparation used was SUPREP via  split dose instruction. Scope In: 8:36:27 AM Scope Out: 9:03:58 AM Scope Withdrawal Time: 0 hours 14 minutes 3 seconds  Total Procedure Duration: 0 hours 27 minutes 31 seconds  Findings:                 A 3 mm polyp was found in the ascending colon. The                            polyp was removed with a cold snare. Resection and                            retrieval were complete.                           A diffuse area of moderate melanosis was found in                            the entire colon. Biopsies were taken with a cold                            forceps for histology.                           Internal hemorrhoids were found during                            retroflexion. The colon was REDUNDANT.                           The exam was otherwise without abnormality on                            direct and retroflexion views. Complications:            No immediate complications. Estimated blood loss:                            None. Estimated Blood  Loss:     Estimated blood loss: none. Impression:               - One 3 mm polyp in the ascending colon, removed                            with a cold snare. Resected and retrieved.                           - Melanosis in the colon. Biopsied.                           - Internal hemorrhoids.                           - The examination was otherwise normal on direct                            and retroflexion views.                           -  Redundant colon Recommendation:           - Repeat colonoscopy in 5 years for surveillance.                           - Patient has a contact number available for                            emergencies. The signs and symptoms of potential                            delayed complications were discussed with the                            patient. Return to normal activities tomorrow.                            Written discharge instructions were provided to the                            patient.                           - Resume previous diet.                           - Continue present medications.                           - Await pathology results. Wilhemina Bonito. Marina Goodell, MD 01/23/2024 9:19:30 AM This report has been signed electronically.

## 2024-01-23 NOTE — Progress Notes (Signed)
 HISTORY OF PRESENT ILLNESS:  [name] [name] is a 53 y.o. female with a family history of multiple relatives with colon cancer and a personal history of sessile serrated polyp.  Now for surveillance colonoscopy REVIEW OF SYSTEMS:  All non-GI ROS negative except for  [name] woke up during knee surgery in 2017   Constipation    uses suppositories prn   Family history of breast cancer    10/11 maternal aunts with breast cancer. Patient has undergone prophylactic b/l mastectomy   Fatigue    Juice plus and testosterone helps   Sleep apnea     Past Surgical History:  Procedure Laterality Date   BREAST RECONSTRUCTION Bilateral 02/05/2018   Performed: BILATERAL Skin SPARING MASTECTOMIES (Bilateral Breast)   BREAST RECONSTRUCTION WITH PLACEMENT OF TISSUE EXPANDER AND FLEX HD (ACELLULAR HYDRATED DERMIS) Bilateral 02/05/2018   Procedure: BILATERAL BREAST RECONSTRUCTION WITH PLACEMENT OF TISSUE EXPANDER AND FLEX HD (ACELLULAR HYDRATED DERMIS);  Surgeon: Etter Sjogren, MD;  Location: [facility] OR;  Service: Plastics;  Laterality: Bilateral;   COLONOSCOPY     NIPPLE SPARING MASTECTOMY Bilateral 02/05/2018   Procedure: BILATERAL Skin SPARING MASTECTOMIES;  Surgeon: Chevis Pretty III, MD;  Location: [facility] OR;  Service: General;  Laterality: Bilateral;   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 She has never been exposed to tobacco smoke. She has never used smokeless tobacco. She reports that she does not currently use alcohol. She reports that she does not use drugs.  family history includes Breast cancer in her mother; Colon cancer in her maternal grandmother and maternal uncle; Heart disease in her father and paternal uncle; Stroke in her father.  Allergies  Allergen Reactions    Oxycodone Nausea And Vomiting and Other (See Comments)    Causes "BAD" feeling, nausea, paranoid       PHYSICAL EXAMINATION: Vital signs: BP 114/64   Pulse 65   Temp 97.9 F (36.6 C) (Temporal)   Ht 5\' 6"  (1.676 m)   Wt 236 lb (107 kg)   LMP 05/12/2019   SpO2 97%   BMI 38.09 kg/m  General: Well-developed, well-nourished, no acute distress HEENT: Sclerae are anicteric, conjunctiva pink. Oral mucosa intact Lungs: Clear Heart: Regular Abdomen: soft, nontender, nondistended, no obvious ascites, no peritoneal signs, normal bowel sounds. No organomegaly. Extremities: No edema Psychiatric: alert and oriented x3. Cooperative     ASSESSMENT:  Family history of colon cancer and personal history of sessile serrated polyp   PLAN:  Surveillance colonoscopy

## 2024-01-26 ENCOUNTER — Telehealth: Payer: Self-pay | Admitting: *Deleted

## 2024-01-26 NOTE — Telephone Encounter (Signed)
 No answer on  follow up call. Left message.

## 2024-01-27 ENCOUNTER — Encounter: Payer: Self-pay | Admitting: Internal Medicine

## 2024-01-27 LAB — SURGICAL PATHOLOGY

## 2024-02-19 ENCOUNTER — Ambulatory Visit: Payer: Self-pay

## 2024-02-19 ENCOUNTER — Encounter: Payer: Self-pay | Admitting: Family Medicine

## 2024-02-19 NOTE — Telephone Encounter (Signed)
 Chief Complaint: throat problem Symptoms: throat pain and discomfort, earache Pertinent Negatives: Patient denies fever, difficulty swallowing or breathing Disposition: [] ED /[] Urgent Care (no appt availability in office) / [x] Appointment(In office/virtual)/ []  [facility]/ [] Home Care/ [] Refused Recommended Disposition /[] [facility]/ []  Follow-up with PCP Additional Notes: Pt reports she thinks she cut her R tonsil when using a tonsil stone removing utensil from [employer]. Pt states there is a white bubble on her R tonsil. Pt states she has discomfort in her R ear as well. Pt states her throat is not very painful, only 1/10 pain. Pt denies fever or difficulty breathing or swallowing. Pt states she feels more fatigued than is normal for her. RN advised pt she should be seen within 24 hours. No availability at her PCP in that timeframe. Pt willing to go to a different office. RN scheduled pt for tomorrow at 1040. RN advised pt if she develops swelling, fever, or difficulty breathing or swallowing she needs to go to the ED. Pt verbalized understanding.     Reason for Triage:  In the back of her throat she had tonsil stones. She got a utensil from [place] that she removes them with. Now she has a abscess in the back of her throat from where she scratch it with the utensil. She wants to see if the doctor will call her in an anabiotic or is there something else she can do to help get rid of it. She is not having any pain. Please call her at [phone]. If you call her in an anabiotic, please send it to [facility].  Reason for Disposition  Earache also present  Answer Assessment - Initial Assessment Questions 1. ONSET: "When did the throat start hurting?" (Hours or days ago)      "It's not pain but I can tell it's back there  2. SEVERITY: "How bad is the sore throat?" (Scale 1-10; mild, moderate or severe)   - MILD (1-3):  Doesn't interfere with eating or normal activities.   -  MODERATE (4-7): Interferes with eating some solids and normal activities.   - SEVERE (8-10):  Excruciating pain, interferes with most normal activities.   - SEVERE WITH DYSPHAGIA (10): Can't swallow liquids, drooling.     1/10 3. 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 States she was around her grandson who might be sick 4.  VIRAL SYMPTOMS: "Are there any symptoms of a cold, such as a runny nose, cough, hoarse voice or red eyes?"      No  5. FEVER: "Do you have a fever?" If Yes, ask: "What is your temperature, how was it measured, and when did it start?"     No "but this is not me this week, I am normally productive, now I just want to sleep. I don't have the energy like I was, I am just wore out" 6. PUS ON THE TONSILS: "Is there pus on the tonsils in the back of your throat?"     Tonsil stones 7. OTHER SYMPTOMS: "Do you have any other symptoms?" (e.g., difficulty breathing, headache, rash)     Denies difficulty swallow or breathing. "I just feel a knot there." No headaches or rashes. Keeps putting a Qtip in her R side ear, states abscess/cut is on that side  "Cut and a little white bubble  next to a flap" after using a utensil to scrap tonsil stones out. States she will not use that item anymore. Pt had pain with impacted tonsil stones. Gargling with listerine and colloidal silver. From my ear down to that side it's an uncomfortable feeling. States she took an allergy pill.  Protocols used: Sore Throat-A-AH

## 2024-02-20 ENCOUNTER — Ambulatory Visit: Admitting: Family

## 2024-02-20 ENCOUNTER — Encounter: Payer: Self-pay | Admitting: Internal Medicine

## 2024-02-20 ENCOUNTER — Ambulatory Visit: Admitting: Internal Medicine

## 2024-02-20 VITALS — BP 112/70 | HR 60 | Temp 98.2°F | Ht 66.0 in | Wt 247.0 lb

## 2024-02-20 DIAGNOSIS — J039 Acute tonsillitis, unspecified: Secondary | ICD-10-CM | POA: Insufficient documentation

## 2024-02-20 MED ORDER — AMOXICILLIN-POT CLAVULANATE 875-125 MG PO TABS
1.0000 | ORAL_TABLET | Freq: Two times a day (BID) | ORAL | 0 refills | Status: DC
Start: 2024-02-20 — End: 2024-03-23

## 2024-02-20 NOTE — Assessment & Plan Note (Signed)
 After cleaning some stones out with tool Looks better now--and no abscess --but is inflamed Also serous otitis on the right  Will give augmentin 875 bid x 7 days If symptoms gone quickly, can stop after 3-5 days

## 2024-02-20 NOTE — Progress Notes (Signed)
 Subjective:    Patient ID: [name] [name], female    DOB: Nov 22, 1970, 53 y.o.   MRN: [id number]  HPI Here due to tonsillar problems  Has tonsillar stones--painful 4 days ago Able to get them out --several Then noticed a possible cut there Having pain and has hole in tonsil No fever  Headache last night Feeling tired for 2 weeks No trouble swallowing--but can feel the tonsil when swallowing stuff Some pain in right ear now also  Did rinse with salt water this morming  Current Outpatient Medications on File Prior to Visit  Medication Sig Dispense Refill   acetaminophen (TYLENOL) 500 MG tablet Take 500 mg by mouth every 6 (six) hours as needed.     cetirizine (ZYRTEC) 10 MG tablet Take 10 mg by mouth daily.     Cholecalciferol (VITAMIN D3) 3000 units TABS Take 3,000 Units by mouth at bedtime.     citalopram (CELEXA) 20 MG tablet Take 1 tablet (20 mg total) by mouth daily. 90 tablet 3   clonazePAM (KLONOPIN) 1 MG tablet Take 0.5-1 tablets (0.5-1 mg total) by mouth 2 (two) times daily as needed for anxiety. 30 tablet 1   Magnesium 125 MG CAPS Take 120 mg by mouth daily.     omeprazole (PRILOSEC) 20 MG capsule Take 1 capsule (20 mg total) by mouth daily. 90 capsule 0   Vitamin C 250 MG TABS Take 350 mg by mouth every other day.     PEG 3350-KCl-NaCl-NaSulf-MgSul (SUFLAVE) 178.7 g SOLR Take 1 kit by mouth as directed. (Patient not taking: Reported on 02/20/2024) 1 each 0   saline (AYR) GEL Place 1 Application into both nostrils every 6 (six) hours. 14 g 5   No current facility-administered medications on file prior to visit.    Allergies  Allergen Reactions   Oxycodone Nausea And Vomiting and Other (See Comments)    Causes "BAD" feeling, nausea, paranoid    [name] woke up during knee surgery in 2017   Constipation    uses suppositories prn   Family history of breast cancer    10/11 maternal aunts with breast  cancer. Patient has undergone prophylactic b/l mastectomy   Fatigue    Juice plus and testosterone helps   Sleep apnea     Past Surgical History:  Procedure Laterality Date   BREAST RECONSTRUCTION Bilateral 02/05/2018   Performed: BILATERAL Skin SPARING MASTECTOMIES (Bilateral Breast)   BREAST RECONSTRUCTION WITH PLACEMENT OF TISSUE EXPANDER AND FLEX HD (ACELLULAR HYDRATED DERMIS) Bilateral 02/05/2018   Procedure: BILATERAL BREAST RECONSTRUCTION WITH PLACEMENT OF TISSUE EXPANDER AND FLEX HD (ACELLULAR HYDRATED DERMIS);  Surgeon: Etter Sjogren, MD;  Location: [facility] OR;  Service: Plastics;  Laterality: Bilateral;   COLONOSCOPY     NIPPLE SPARING MASTECTOMY Bilateral 02/05/2018   Procedure: BILATERAL Skin SPARING MASTECTOMIES;  Surgeon: Griselda Miner, MD;  Location: [facility] OR;  Service: General;  Laterality: Bilateral;   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   torn meniscus  2015   left leg/ right knee in 2016   WISDOM TOOTH EXTRACTION     in mid 20's    Family History  Problem Relation Age of Onset   Colon cancer Maternal Grandmother    Breast cancer Mother        10/11 maternal aunts had breast cancer   Heart disease Father        MI at age 40  Stroke Father    Colon cancer Maternal Uncle    Heart disease Paternal Uncle        MI at age 94   Esophageal cancer Neg Hx    Stomach cancer Neg Hx    Rectal cancer Neg Hx     Social History   Socioeconomic History   Marital status: Married    Spouse name: Not on file   Number of children: 2   Years of education: Not on file   Highest education level: Not on file  Occupational History   Not on file  Tobacco Use   Smoking status: Never    Passive exposure: Never   Smokeless tobacco: Never  Vaping Use   Vaping status: Never Used  Substance and Sexual Activity   Alcohol use: Not Currently    Comment: rarely   Drug use: No   Sexual activity: Yes    Comment: husband had vasectomy  Other Topics Concern   Not on file   Social History Narrative   Not on file   Social Drivers of Health   Financial Resource Strain: Not on file  Food Insecurity: Not on file  Transportation Needs: Not on file  Physical Activity: Not on file  Stress: Not on file  Social Connections: Not on file  Intimate Partner Violence: Not on file   Review of Systems No N/V Eating okay Is on citalopram--wonders if that is making her sleepy (discussed that she can switch to night dosing)     Objective:   Physical Exam Constitutional:      Appearance: Normal appearance.  HENT:     Head:     Comments: No sinus tenderness    Left Ear: Tympanic membrane and ear canal normal.     Ears:     Comments: Right TM is bulging but not inflamed    Mouth/Throat:     Comments: 1+ tonsillar enlargement --inflamed (but no exudate/stone or abscess visible) Pulmonary:     Effort: Pulmonary effort is normal.     Breath sounds: Normal breath sounds. No wheezing or rales.  Musculoskeletal:     Cervical back: Neck supple.  Lymphadenopathy:     Cervical: No cervical adenopathy.  Neurological:     Mental Status: She is alert.            Assessment & Plan:

## 2024-03-12 ENCOUNTER — Encounter: Payer: Self-pay | Admitting: Podiatry

## 2024-03-12 ENCOUNTER — Ambulatory Visit: Admitting: Podiatry

## 2024-03-12 DIAGNOSIS — L600 Ingrowing nail: Secondary | ICD-10-CM | POA: Diagnosis not present

## 2024-03-12 NOTE — Progress Notes (Unsigned)
  Subjective:  Patient ID: [name] [name], female    DOB: 07-Jul-2006,  MRN: [id number]  Chief Complaint  Patient presents with   Ingrown Toenail    Patient states it has been a month and she has an ingrown toe nail on left foot hallux, patient is taking medication for pain.     53 y.o. female presents with the above complaint.  Patient presents with left hallux medial border ingrown painful to touch is progressive and worse worse with ambulation worse with pressure she has not seen MRIs prior to seeing me.  She would like to discuss treatment options pain scale 7 out of 10 dull aching nature   Review of Systems: Negative except as noted in the HPI. Denies N/V/F/Ch.  History reviewed. No pertinent past medical history. No current outpatient medications on file.  Current Facility-Administered Medications:    medroxyPROGESTERone (DEPO-PROVERA) injection 150 mg, 150 mg, Intramuscular, Q90 days, , 150 mg at 08/18/23 0865  Social History   Tobacco Use  Smoking Status Never  Smokeless Tobacco Never    Allergies  Allergen Reactions   Penicillin G    Objective:  There were no vitals filed for this visit. There is no height or weight on file to calculate BMI. Constitutional Well developed. Well nourished.  Vascular Dorsalis pedis pulses palpable bilaterally. Posterior tibial pulses palpable bilaterally. Capillary refill normal to all digits.  No cyanosis or clubbing noted. Pedal hair growth normal.  Neurologic Normal speech. Oriented to person, place, and time. Epicritic sensation to light touch grossly present bilaterally.  Dermatologic Painful ingrowing nail at medial nail borders of the hallux nail left. No other open wounds. No skin lesions.  Orthopedic: Normal joint ROM without pain or crepitus bilaterally. No visible deformities. No bony tenderness.   Radiographs: None Assessment:   1. Ingrown left big toenail    Plan:  Patient was evaluated and treated  and all questions answered.  Ingrown Nail, left -Patient elects to proceed with minor surgery to remove ingrown toenail removal today. Consent reviewed and signed by patient. -Ingrown nail excised. See procedure note. -Educated on post-procedure care including soaking. Written instructions provided and reviewed. -Patient to follow up in 2 weeks for nail check.  Procedure: Excision of Ingrown Toenail Location: Left 1st toe medial nail borders. Anesthesia: Lidocaine 1% plain; 1.5 mL and Marcaine 0.5% plain; 1.5 mL, digital block. Skin Prep: Betadine. Dressing: Silvadene; telfa; dry, sterile, compression dressing. Technique: Following skin prep, the toe was exsanguinated and a tourniquet was secured at the base of the toe. The affected nail border was freed, split with a nail splitter, and excised. Chemical matrixectomy was then performed with phenol and irrigated out with alcohol. The tourniquet was then removed and sterile dressing applied. Disposition: Patient tolerated procedure well. Patient to return in 2 weeks for follow-up.   No follow-ups on file.

## 2024-03-22 NOTE — Progress Notes (Unsigned)
 se     Brigitte Canard, PA-C [address] [place], [place]  [address] Phone: [phone]   Primary Care Physician: Noreene Bearded, PA  Primary Gastroenterologist:  Brigitte Canard, PA-C / Legrand Puma, MD   Chief Complaint:  Hiatal Hernia, GERD, Dysphagia       HPI:   [name] [name] is a 53 y.o. female who presents to evaluate Hiatal Hernia, GERD, and Dysphagia.  Patient states she has had intermittent acid reflux for 3 to 5 years.  Reflux worsened in the past few months.  She was recently started on Prilosec 20 Mg once daily which has helped.  Acid reflux improved on PPI.  She has occasional episode of solid food dysphagia if she eats large pieces of meat such as beef.  Feels like it gets stuck in her mid chest and will not go down.  She denies abdominal pain, nausea, vomiting, food bolus episodes, or weight loss.  She has never had an EGD.  01/17/24 CT Chest Cardiac Score was 0.  Incidental Small to moderate-sized hiatal hernia.   01/2024 Colonoscopy by Dr. Elvin Hammer: One small 3mm polyp removed.  Melanosis Coli.  Internal hemorrhoids.  5 year repeat (due 01/2029).  Personal history of sessile serrated colon polyp ( less than 10 mm in size) with no dysplasia. Family history of colon cancer in multiple second- degree relatives.  Current Outpatient Medications  Medication Sig Dispense Refill   acetaminophen  (TYLENOL ) 500 MG tablet Take 500 mg by mouth every 6 (six) hours as needed.     cetirizine (ZYRTEC) 10 MG tablet Take 10 mg by mouth daily.     Cholecalciferol (VITAMIN D3) 3000 units TABS Take 3,000 Units by mouth at bedtime.     clonazePAM  (KLONOPIN ) 1 MG tablet Take 0.5-1 tablets (0.5-1 mg total) by mouth 2 (two) times daily as needed for anxiety. 30 tablet 1   Magnesium 125 MG CAPS Take 120 mg by mouth daily.     meloxicam (MOBIC) 15 MG tablet Take 15 mg by mouth daily.     omeprazole  (PRILOSEC) 20 MG capsule Take 1 capsule (20 mg total) by mouth daily. 90 capsule 0   saline (AYR) GEL  Place 1 Application into both nostrils every 6 (six) hours. 14 g 5   Vitamin C 250 MG TABS Take 350 mg by mouth every other day.     citalopram  (CELEXA ) 20 MG tablet Take 1 tablet (20 mg total) by mouth daily. (Patient not taking: Reported on 03/23/2024) 90 tablet 3   No current facility-administered medications for this visit.    Allergies as of 03/23/2024 - Review Complete 03/23/2024  Allergen Reaction Noted   Oxycodone Nausea And Vomiting and Other (See Comments) 12/09/2017    [name] woke up during knee surgery in 2017   Constipation    uses suppositories prn   Family history of breast cancer    10/11 maternal aunts with breast cancer. Patient has undergone prophylactic b/l mastectomy   Fatigue    Juice plus and testosterone helps   Sleep apnea     Past Surgical History:  Procedure Laterality Date   BREAST RECONSTRUCTION Bilateral 02/05/2018   Performed: BILATERAL Skin SPARING MASTECTOMIES (Bilateral Breast)   BREAST RECONSTRUCTION WITH PLACEMENT OF TISSUE EXPANDER AND FLEX HD (ACELLULAR HYDRATED DERMIS) Bilateral 02/05/2018   Procedure: BILATERAL BREAST RECONSTRUCTION WITH PLACEMENT OF TISSUE EXPANDER AND FLEX HD (ACELLULAR HYDRATED DERMIS);  Surgeon:  Elidia Grout, MD;  Location: [facility] OR;  Service: Plastics;  Laterality: Bilateral;   COLONOSCOPY     NIPPLE SPARING MASTECTOMY Bilateral 02/05/2018   Procedure: BILATERAL Skin SPARING MASTECTOMIES;  Surgeon: Caralyn Chandler, MD;  Location: [facility] OR;  Service: General;  Laterality: Bilateral;   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   torn meniscus  2015   left leg/ right knee in 2016   WISDOM TOOTH EXTRACTION     in mid 20's    Review of Systems:    All systems reviewed and negative except where noted in HPI.    Physical Exam:  Ht 5\' 6"  (1.676 m)   Wt 249 lb 2 oz (113 kg)   LMP 05/12/2019   BMI 40.21 kg/m  Patient's last menstrual period was  05/12/2019.  General: Well-nourished, obese, well-developed in no acute distress.  Lungs: Clear to auscultation bilaterally. Non-labored. Heart: Regular rate and rhythm, no murmurs rubs or gallops.  Abdomen: Bowel sounds are normal; Abdomen is Soft; No hepatosplenomegaly, masses or hernias;  No Abdominal Tenderness; No guarding or rebound tenderness. Neuro: Alert and oriented x 3.  Grossly intact.  Psych: Alert and cooperative, normal mood and affect.   Imaging Studies: No results found.  Labs: CBC    Component Value Date/Time   WBC 5.9 09/16/2023 0917   WBC 7.5 01/28/2018 1045   RBC 4.68 09/16/2023 0917   RBC 4.58 07/02/2021 0000   HGB 12.9 09/16/2023 0917   HCT 40.2 09/16/2023 0917   PLT 409 09/16/2023 0917   MCV 86 09/16/2023 0917   MCH 27.6 09/16/2023 0917   MCH 28.1 01/28/2018 1045   MCHC 32.1 09/16/2023 0917   MCHC 32.6 01/28/2018 1045   RDW 13.5 09/16/2023 0917   LYMPHSABS 1.4 09/16/2023 0917   EOSABS 0.2 09/16/2023 0917   BASOSABS 0.0 09/16/2023 0917    CMP     Component Value Date/Time   NA 142 09/16/2023 0917   K 4.4 09/16/2023 0917   CL 103 09/16/2023 0917   CO2 24 09/16/2023 0917   GLUCOSE 83 09/16/2023 0917   GLUCOSE 98 01/28/2018 1045   BUN 19 09/16/2023 0917   CREATININE 0.80 09/16/2023 0917   CALCIUM 9.5 09/16/2023 0917   PROT 6.6 09/16/2023 0917   ALBUMIN 4.2 09/16/2023 0917   AST 13 09/16/2023 0917   ALT 13 09/16/2023 0917   ALKPHOS 82 09/16/2023 0917   BILITOT 0.4 09/16/2023 0917   GFRNONAA >60 01/28/2018 1045   GFRAA >60 01/28/2018 1045       Assessment and Plan:   [name] [name] is a 53 y.o. y/o female presents for:  Hiatal Hernia - Patient Education Discussed. - Continue treatment for acid reflux. - Hiatal hernia surgery is not recommended unless GERD symptoms are severe and not controlled on medication.  2. Dysphagia to solid foods: Evaluate for esophageal stricture or EOE. -Schedule EGD with Possible Dilation I discussed risks  of EGD with patient to include risk of bleeding, perforation, and risk of sedation.  Patient expressed understanding and agrees to proceed with EGD.   3.  GERD -Continue Prilosec 20 Mg once daily, #90, 3 refills. -Recommend Lifestyle Modifications to prevent Acid Reflux.  Rec. Avoid coffee, sodas, peppermint, garlic, onions, alcohol, citrus fruits, chocolate, tomatoes, fatty and spicey foods.  Avoid eating 2-3 hours before bedtime.    4.  Personal History of Colon Polyps; And Family History of Colon Cancer - 5 year repeat Colonoscopy will be due 01/2029.  Brigitte Canard, PA-C  Follow up based on EGD results and GI symptoms.

## 2024-03-23 ENCOUNTER — Encounter: Payer: Self-pay | Admitting: Physician Assistant

## 2024-03-23 ENCOUNTER — Ambulatory Visit: Admitting: Physician Assistant

## 2024-03-23 VITALS — BP 100/62 | HR 72 | Ht 66.0 in | Wt 249.1 lb

## 2024-03-23 DIAGNOSIS — Z860100 Personal history of colon polyps, unspecified: Secondary | ICD-10-CM

## 2024-03-23 DIAGNOSIS — K449 Diaphragmatic hernia without obstruction or gangrene: Secondary | ICD-10-CM | POA: Diagnosis not present

## 2024-03-23 DIAGNOSIS — K219 Gastro-esophageal reflux disease without esophagitis: Secondary | ICD-10-CM | POA: Diagnosis not present

## 2024-03-23 DIAGNOSIS — Z800 Family history of malignant neoplasm of digestive organs: Secondary | ICD-10-CM

## 2024-03-23 DIAGNOSIS — R1310 Dysphagia, unspecified: Secondary | ICD-10-CM

## 2024-03-23 MED ORDER — OMEPRAZOLE 20 MG PO CPDR: 20.0000 mg | DELAYED_RELEASE_CAPSULE | Freq: Every day | ORAL | 3 refills | Status: AC

## 2024-03-23 NOTE — Patient Instructions (Signed)
 You have been scheduled for an endoscopy. Please follow written instructions given to you at your visit today.  If you use inhalers (even only as needed), please bring them with you on the day of your procedure.  If you take any of the following medications, they will need to be adjusted prior to your procedure:   DO NOT TAKE 7 DAYS PRIOR TO TEST- Trulicity (dulaglutide) Ozempic , Wegovy  (semaglutide ) Mounjaro (tirzepatide) Bydureon Bcise (exanatide extended release)  DO NOT TAKE 1 DAY PRIOR TO YOUR TEST Rybelsus (semaglutide ) Adlyxin (lixisenatide) Victoza (liraglutide ) Byetta (exanatide) ___________________________________________________________________________  Please follow up sooner if symptoms increase or worsen  Due to recent changes in healthcare laws, you may see the results of your imaging and laboratory studies on MyChart before your provider has had a chance to review them.  We understand that in some cases there may be results that are confusing or concerning to you. Not all laboratory results come back in the same time frame and the provider may be waiting for multiple results in order to interpret others.  Please give us  48 hours in order for your provider to thoroughly review all the results before contacting the office for clarification of your results.   _______________________________________________________  If your blood pressure at your visit was 140/90 or greater, please contact your primary care physician to follow up on this.  _______________________________________________________  If you are age 54 or older, your body mass index should be between 23-30. Your Body mass index is 40.21 kg/m. If this is out of the aforementioned range listed, please consider follow up with your Primary Care Provider.  If you are age 8 or younger, your body mass index should be between 19-25. Your Body mass index is 40.21 kg/m. If this is out of the aformentioned range listed,  please consider follow up with your Primary Care Provider.   ________________________________________________________  The [facility] providers would like to encourage you to use MYCHART to communicate with providers for non-urgent requests or questions.  Due to long hold times on the telephone, sending your provider a message by [facility] may be a faster and more efficient way to get a response.  Please allow 48 business hours for a response.  Please remember that this is for non-urgent requests.  _______________________________________________________ Thank you for trusting me with your gastrointestinal care!   Brigitte Canard, PA

## 2024-03-24 ENCOUNTER — Encounter: Payer: Self-pay | Admitting: Internal Medicine

## 2024-03-30 NOTE — Progress Notes (Signed)
 Noted

## 2024-04-02 ENCOUNTER — Encounter: Payer: Self-pay | Admitting: Internal Medicine

## 2024-04-02 ENCOUNTER — Ambulatory Visit (AMBULATORY_SURGERY_CENTER): Admitting: Internal Medicine

## 2024-04-02 VITALS — BP 118/72 | HR 68 | Temp 98.6°F | Resp 13 | Ht 66.0 in | Wt 249.0 lb

## 2024-04-02 DIAGNOSIS — K219 Gastro-esophageal reflux disease without esophagitis: Secondary | ICD-10-CM | POA: Diagnosis not present

## 2024-04-02 DIAGNOSIS — K449 Diaphragmatic hernia without obstruction or gangrene: Secondary | ICD-10-CM | POA: Diagnosis present

## 2024-04-02 DIAGNOSIS — R1310 Dysphagia, unspecified: Secondary | ICD-10-CM

## 2024-04-02 DIAGNOSIS — Q399 Congenital malformation of esophagus, unspecified: Secondary | ICD-10-CM

## 2024-04-02 MED ORDER — SODIUM CHLORIDE 0.9 % IV SOLN: 500.0000 mL | Freq: Once | INTRAVENOUS | Status: DC

## 2024-04-02 NOTE — Progress Notes (Signed)
 Dental work on right side of mouth since last office visit. Tongue numb on right side from procedure on 03/31/2024 at [facility].   Ingrown toenail removed on Left toe 2 weeks ago at [facility].   Pt wants Dr. Elvin Hammer to know she only has difficulty swallowing in stressful situations but not daily.

## 2024-04-02 NOTE — Progress Notes (Signed)
 Expand All Collapse All se     [name] Canard, PA-C [address] [place], [place]  [address] Phone: [phone]     Primary Care Physician: [name] Bearded, PA   Primary Gastroenterologist:  [name] Canard, PA-C / Legrand Puma, MD    Chief Complaint:  Hiatal Hernia, GERD, Dysphagia        HPI:   [name] [name] is a 53 y.o. female who presents to evaluate Hiatal Hernia, GERD, and Dysphagia.  Patient states she has had intermittent acid reflux for 3 to 5 years.  Reflux worsened in the past few months.  She was recently started on Prilosec 20 Mg once daily which has helped.  Acid reflux improved on PPI.  She has occasional episode of solid food dysphagia if she eats large pieces of meat such as beef.  Feels like it gets stuck in her mid chest and will not go down.  She denies abdominal pain, nausea, vomiting, food bolus episodes, or weight loss.  She has never had an EGD.   01/17/24 CT Chest Cardiac Score was 0.  Incidental Small to moderate-sized hiatal hernia.    01/2024 Colonoscopy by Dr. Elvin Hammer: One small 3mm polyp removed.  Melanosis Coli.  Internal hemorrhoids.  5 year repeat (due 01/2029).   Personal history of sessile serrated colon polyp ( less than 10 mm in size) with no dysplasia. Family history of colon cancer in multiple second- degree relatives.         Current Outpatient Medications  Medication Sig Dispense Refill   acetaminophen  (TYLENOL ) 500 MG tablet Take 500 mg by mouth every 6 (six) hours as needed.       cetirizine (ZYRTEC) 10 MG tablet Take 10 mg by mouth daily.       Cholecalciferol (VITAMIN D3) 3000 units TABS Take 3,000 Units by mouth at bedtime.       clonazePAM  (KLONOPIN ) 1 MG tablet Take 0.5-1 tablets (0.5-1 mg total) by mouth 2 (two) times daily as needed for anxiety. 30 tablet 1   Magnesium 125 MG CAPS Take 120 mg by mouth daily.       meloxicam (MOBIC) 15 MG tablet Take 15 mg by mouth daily.       omeprazole  (PRILOSEC) 20 MG capsule Take 1 capsule (20 mg total)  by mouth daily. 90 capsule 0   saline (AYR) GEL Place 1 Application into both nostrils every 6 (six) hours. 14 g 5   Vitamin C 250 MG TABS Take 350 mg by mouth every other day.       citalopram  (CELEXA ) 20 MG tablet Take 1 tablet (20 mg total) by mouth daily. (Patient not taking: Reported on 03/23/2024) 90 tablet 3      No current facility-administered medications for this visit.             Allergies as of 03/23/2024 - Review Complete 03/23/2024  Allergen Reaction Noted   Oxycodone Nausea And Vomiting and Other (See Comments) 12/09/2017          Past Medical History:  Diagnosis Date   Complication of anesthesia      Pt woke up during knee surgery in 2017   Constipation      uses suppositories prn   Family history of breast cancer      10/11 maternal aunts with breast cancer. Patient has undergone prophylactic b/l mastectomy   Fatigue      Juice plus and testosterone helps   Sleep apnea  Past Surgical History:  Procedure Laterality Date   BREAST RECONSTRUCTION Bilateral 02/05/2018    Performed: BILATERAL Skin SPARING MASTECTOMIES (Bilateral Breast)   BREAST RECONSTRUCTION WITH PLACEMENT OF TISSUE EXPANDER AND FLEX HD (ACELLULAR HYDRATED DERMIS) Bilateral 02/05/2018    Procedure: BILATERAL BREAST RECONSTRUCTION WITH PLACEMENT OF TISSUE EXPANDER AND FLEX HD (ACELLULAR HYDRATED DERMIS);  Surgeon: Elidia Grout, MD;  Location: [facility] OR;  Service: Plastics;  Laterality: Bilateral;   COLONOSCOPY       NIPPLE SPARING MASTECTOMY Bilateral 02/05/2018    Procedure: BILATERAL Skin SPARING MASTECTOMIES;  Surgeon: Caralyn Chandler, MD;  Location: [facility] OR;  Service: General;  Laterality: Bilateral;   REPLACEMENT TOTAL KNEE   2017    right knee   torn meniscus   2015    left leg/ right knee in 2016   WISDOM TOOTH EXTRACTION        in mid 20's          Review of Systems:    All systems reviewed and negative except where noted in HPI.      Physical Exam:  Ht 5\' 6"  (1.676  m)   Wt 249 lb 2 oz (113 kg)   LMP 05/12/2019   BMI 40.21 kg/m  Patient's last menstrual period was 05/12/2019.   General: Well-nourished, obese, well-developed in no acute distress.  Lungs: Clear to auscultation bilaterally. Non-labored. Heart: Regular rate and rhythm, no murmurs rubs or gallops.  Abdomen: Bowel sounds are normal; Abdomen is Soft; No hepatosplenomegaly, masses or hernias;  No Abdominal Tenderness; No guarding or rebound tenderness. Neuro: Alert and oriented x 3.  Grossly intact.  Psych: Alert and cooperative, normal mood and affect.     Imaging Studies: Imaging Results  No results found.     Labs: CBC Labs (Brief)          Component Value Date/Time    WBC 5.9 09/16/2023 0917    WBC 7.5 01/28/2018 1045    RBC 4.68 09/16/2023 0917    RBC 4.58 07/02/2021 0000    HGB 12.9 09/16/2023 0917    HCT 40.2 09/16/2023 0917    PLT 409 09/16/2023 0917    MCV 86 09/16/2023 0917    MCH 27.6 09/16/2023 0917    MCH 28.1 01/28/2018 1045    MCHC 32.1 09/16/2023 0917    MCHC 32.6 01/28/2018 1045    RDW 13.5 09/16/2023 0917    LYMPHSABS 1.4 09/16/2023 0917    EOSABS 0.2 09/16/2023 0917    BASOSABS 0.0 09/16/2023 0917        CMP     Labs (Brief)          Component Value Date/Time    NA 142 09/16/2023 0917    K 4.4 09/16/2023 0917    CL 103 09/16/2023 0917    CO2 24 09/16/2023 0917    GLUCOSE 83 09/16/2023 0917    GLUCOSE 98 01/28/2018 1045    BUN 19 09/16/2023 0917    CREATININE 0.80 09/16/2023 0917    CALCIUM 9.5 09/16/2023 0917    PROT 6.6 09/16/2023 0917    ALBUMIN 4.2 09/16/2023 0917    AST 13 09/16/2023 0917    ALT 13 09/16/2023 0917    ALKPHOS 82 09/16/2023 0917    BILITOT 0.4 09/16/2023 0917    GFRNONAA >60 01/28/2018 1045    GFRAA >60 01/28/2018 1045              Assessment and Plan:    [name] is a 53  y.o. y/o female presents for:   Hiatal Hernia - Patient Education Discussed. - Continue treatment for acid reflux. - Hiatal hernia  surgery is not recommended unless GERD symptoms are severe and not controlled on medication.   2. Dysphagia to solid foods: Evaluate for esophageal stricture or EOE. -Schedule EGD with Possible Dilation I discussed risks of EGD with patient to include risk of bleeding, perforation, and risk of sedation.  Patient expressed understanding and agrees to proceed with EGD.    3.  GERD -Continue Prilosec 20 Mg once daily, #90, 3 refills. -Recommend Lifestyle Modifications to prevent Acid Reflux.  Rec. Avoid coffee, sodas, peppermint, garlic, onions, alcohol, citrus fruits, chocolate, tomatoes, fatty and spicey foods.  Avoid eating 2-3 hours before bedtime.     4.  Personal History of Colon Polyps; And Family History of Colon Cancer - 5 year repeat Colonoscopy will be due 01/2029.   [name] Canard, PA-C

## 2024-04-02 NOTE — Patient Instructions (Signed)

## 2024-04-02 NOTE — Progress Notes (Signed)
 Sedate, gd SR, tolerated procedure well, VSS, report to RN

## 2024-04-02 NOTE — Op Note (Signed)
  [facility] Patient Name: [name] [name] Procedure Date: 04/02/2024 9:17 AM MRN: [id number] Endoscopist: Murel Arlington. Elvin Hammer , MD, [id number] Age: 53 Referring MD:  Date of Birth: 02/01/1971 Gender: Female Account #: [id number] Procedure:                Upper GI endoscopy Indications:              Esophageal reflux, dysphagia.?"?"Patient tells me                            that she has more of a globus sensation than                            intermittent solid food dysphagia described in the                            office Medicines:                Monitored Anesthesia Care Procedure:                Pre-Anesthesia Assessment:                           - Prior to the procedure, a History and Physical                            was performed, and patient medications and                            allergies were reviewed. The patient's tolerance of                            previous anesthesia was also reviewed. The risks                            and benefits of the procedure and the sedation                            options and risks were discussed with the patient.                            All questions were answered, and informed consent                            was obtained. Prior Anticoagulants: The patient has                            taken no anticoagulant or antiplatelet agents. ASA                            Grade Assessment: II - A patient with mild systemic                            disease. After reviewing the risks and benefits,  the patient was deemed in satisfactory condition to                            undergo the procedure.                           After obtaining informed consent, the endoscope was                            passed under direct vision. Throughout the                            procedure, the patient's blood pressure, pulse, and                            oxygen saturations were monitored continuously. The                             GIF [id number] #[id number] was introduced through the                            mouth, and advanced to the second part of duodenum.                            The upper GI endoscopy was accomplished without                            difficulty. The patient tolerated the procedure                            well. Scope In: Scope Out: Findings:                 The esophagus was slightly tortuous and                            foreshortened. No obvious stricture.                           The stomach revealed a moderate hiatal hernia but                            was otherwise normal.                           The examined duodenum was normal.                           The cardia and gastric fundus were normal on                            retroflexion. Complications:            No immediate complications. Estimated Blood Loss:     Estimated blood loss: none. Impression:               1. GERD  2. EGD with hiatal hernia. Recommendation:           - Patient has a contact number available for                            emergencies. The signs and symptoms of potential                            delayed complications were discussed with the                            patient. Return to normal activities tomorrow.                            Written discharge instructions were provided to the                            patient.                           - Resume previous diet.                           - Continue present medications.                           - Reflux precautions with attention to weight loss                           - Return to the care of your primary provider Murel Arlington. Elvin Hammer, MD 04/02/2024 9:46:40 AM This report has been signed electronically.

## 2024-04-06 ENCOUNTER — Telehealth: Payer: Self-pay

## 2024-04-06 NOTE — Telephone Encounter (Signed)
 Left message on follow up call.

## 2024-08-04 NOTE — Progress Notes (Signed)
 PATIENT: [name] [name] DOB: Apr 29, 1971  REASON FOR VISIT: follow up HISTORY FROM: patient  Chief Complaint  Patient presents with   Follow-up    Pt in room 10. Alone. Here for cpap follow up.     HISTORY OF PRESENT ILLNESS:  08/09/24 ALL:  [name] [name] returns for follow up for OSA on CPAP. She continues to do well on therapy. She is using CPAP most nights for about 6 hours, on average. She is tolerating therapy without concerns with machine. She is interested in trying a nasal pillow mask. She is working with weight management and has lost 24lbs on compounded tripeptide. She is feeling well and without concerns, today.     08/07/2023 ALL:  [name] [name] is a 53 y.o. female here today for follow up for OSA on CPAP. She was seen in consult with Dr Chalice 04/2023. HST 05/2023 showed the presence of very strongly REM sleep dependent mild to moderate obstructive sleep apnea without any evidence of central sleep apnea being present. AutoPAP advised. Since, she reports doing well. She is using CPAP nightly for about 4-5 hours, on average. She denies any concerns with macine or mask. She does have a small area of redness on her chin and feels it is from mask. She does continues to feel tired during the day. 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 She usually gets 5-6 hours of sleep. She does not exercise. PCP started modafinil  but she was scared to try it.     HISTORY: (copied from Dr Dohmeier's previous note)  [name] [name] is a 53 y.o. female patient who is seen upon referral on 04/29/2023 from PCP  [facility] for a Sleep consult. .  Chief concern according to patient :  see above   I have the pleasure of seeing [name] [name] 04/29/23 a right-handed female with a possible sleep disorder.  She reports stress related weight gain, and had multiple surgeries: 2 total knees, preventive bilateral mastectomies, and it has been difficult to lose weight again and to  maintain it. [place] a[place] raid of driving there, due to sleepiness. .    Sleep relevant medical history: Nocturia 3 times each night,  no ENT surgeries, no thyroid disease,    Family medical /sleep history: breast cancer in her mother, she herself was genetically predestined.  Father was on CPAP with OSA, had CVA, atrial fib and CHF- CAD.  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  and lives in a household with spouse and grandsons.  She has a daughter living close by. The patient currently works as a Surveyor, mining.  Tobacco use: none .  ETOH use ; none,  Caffeine intake in form of Coffee( 2  in AM ) Soda( /) Tea ( rarely) no energy drinks Exercise in form of walking.  Gardening.  Too fatigued to exercise.    Sleep habits are as follows: The patient's dinner time is between 6-7 PM. The patient goes to bed at 9 PM and continues to sleep for 7-8 hours, wakes for many bathroom breaks,bedroom is cool, quiet and dark.  The preferred sleep position is left laterally ( mastectomy) , with the support of 1 pillow. Dreams are reportedly frequent/vivid.  The patient wakes up spontaneously without an alarm. 5.30  AM is the usual rise time. She reports not feeling refreshed or restored in AM, with symptoms such as  morning headaches, and residual fatigue. Naps are taken frequently, not scheduled,  lasting from 10 to 20 minutes and are more refreshing .   REVIEW OF SYSTEMS: Out of a complete 14 system review of symptoms, the patient complains only of the following symptoms, none and all other reviewed systems are negative.  ESS: 9/24 FSS: 34/63  ALLERGIES: Allergies  Allergen Reactions   Oxycodone Nausea And Vomiting and Other (See Comments)    Causes BAD feeling, nausea, paranoid    HOME  MEDICATIONS: Outpatient Medications Prior to Visit  Medication Sig Dispense Refill   acetaminophen  (TYLENOL ) 500 MG tablet Take 500 mg by mouth every 6 (six) hours as needed.     Cholecalciferol (VITAMIN D3) 3000 units TABS Take 3,000 Units by mouth at bedtime.     Magnesium 125 MG CAPS Take 120 mg by mouth daily.     meloxicam (MOBIC) 15 MG tablet Take 15 mg by mouth daily.     omeprazole  (PRILOSEC) 20 MG capsule Take 1 capsule (20 mg total) by mouth daily. 90 capsule 3   saline (AYR) GEL Place 1 Application into both nostrils every 6 (six) hours. 14 g 5   Vitamin C 250 MG TABS Take 350 mg by mouth every other day.     cetirizine (ZYRTEC) 10 MG tablet Take 10 mg by mouth daily. (Patient not taking: Reported on 08/09/2024)     citalopram  (CELEXA ) 20 MG tablet Take 1 tablet (20 mg total) by mouth daily. (Patient not taking: Reported on 08/09/2024) 90 tablet 3   clonazePAM  (KLONOPIN ) 1 MG tablet Take 0.5-1 tablets (0.5-1 mg total) by mouth 2 (two) times daily as needed for anxiety. (Patient not taking: Reported on 08/09/2024) 30 tablet 1   No facility-administered medications prior to visit.    PAST MEDICAL HISTORY: [name] woke up during knee surgery in 2017   Constipation    uses suppositories prn   Family history of breast cancer    10/11 maternal aunts with breast cancer. Patient has undergone prophylactic b/l mastectomy   Fatigue    Juice plus and testosterone helps   Sleep apnea    CPAP    PAST SURGICAL HISTORY: Past Surgical History:  Procedure Laterality Date   BREAST RECONSTRUCTION Bilateral 02/05/2018   Performed: BILATERAL Skin SPARING MASTECTOMIES (Bilateral Breast)   BREAST RECONSTRUCTION WITH PLACEMENT OF TISSUE EXPANDER AND FLEX HD (ACELLULAR HYDRATED DERMIS) Bilateral 02/05/2018   Procedure: BILATERAL BREAST RECONSTRUCTION WITH PLACEMENT OF TISSUE EXPANDER AND FLEX HD (ACELLULAR HYDRATED DERMIS);   Surgeon: Leora Lenis, MD;  Location: [facility] OR;  Service: Plastics;  Laterality: Bilateral;   COLONOSCOPY     NIPPLE SPARING MASTECTOMY Bilateral 02/05/2018   Procedure: BILATERAL Skin SPARING MASTECTOMIES;  Surgeon: Curvin Mt III, MD;  Location: [facility] OR;  Service: General;  Laterality: Bilateral;   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   torn meniscus  2015   left leg/ right knee in 2016   WISDOM TOOTH EXTRACTION     in mid 20's    FAMILY HISTORY: Family History  Problem Relation Age of Onset   Colon cancer Maternal Grandmother    Breast cancer Mother        10/11 maternal aunts had breast cancer  Heart disease Father        MI at age 72   Stroke Father    Colon cancer Maternal Uncle    Heart disease Paternal Uncle        MI at age 27   Esophageal cancer Neg Hx    Stomach cancer Neg Hx    Rectal cancer Neg Hx     SOCIAL HISTORY: Social History   Socioeconomic History   Marital status: Married    Spouse name: Not on file   Number of children: 2   Years of education: Not on file   Highest education level: Not on file  Occupational History   Not on file  Tobacco Use   Smoking status: Never    Passive exposure: Never   Smokeless tobacco: Never  Vaping Use   Vaping status: Never Used  Substance and Sexual Activity   Alcohol use: Not Currently    Comment: rarely   Drug use: No   Sexual activity: Yes    Comment: husband had vasectomy  Other Topics Concern   Not on file  Social History Narrative   Not on file   Social Drivers of Health   Financial Resource Strain: Not on file  Food Insecurity: Low Risk  (03/30/2024)   Received from [facility]   Hunger Vital Sign    Within the past 12 months, you worried that your food would run out before you got money to buy more: Never true    Within the past 12 months, the food you bought just didn't last and you didn't have money to get more. : Never true  Transportation Needs: No Transportation Needs (03/30/2024)    Received from [facility]    In the past 12 months, has lack of reliable transportation kept you from medical appointments, meetings, work or from getting things needed for daily living? : No  Physical Activity: Not on file  Stress: Not on file  Social Connections: Not on file  Intimate Partner Violence: Not on file     PHYSICAL EXAM  Vitals:   08/09/24 1035  BP: 102/70  Pulse: 87  Weight: 238 lb 8 oz (108.2 kg)  Height: 5' 6 (1.676 m)    Body mass index is 38.49 kg/m.  Generalized: Well developed, in no acute distress  Cardiology: normal rate and rhythm, no murmur noted Respiratory: clear to auscultation bilaterally  Neurological examination  Mentation: Alert oriented to time, place, history taking. Follows all commands speech and language fluent Cranial nerve II-XII: Pupils were equal round reactive to light. Extraocular movements were full, visual field were full  Motor: The motor testing reveals 5 over 5 strength of all 4 extremities. Good symmetric motor tone is noted throughout.  Gait and station: Gait is normal.    DIAGNOSTIC DATA (LABS, IMAGING, TESTING) - I reviewed patient records, labs, notes, testing and imaging myself where available.      No data to display           Lab Results  Component Value Date   WBC 5.9 09/16/2023   HGB 12.9 09/16/2023   HCT 40.2 09/16/2023   MCV 86 09/16/2023   PLT 409 09/16/2023      Component Value Date/Time   NA 142 09/16/2023 0917   K 4.4 09/16/2023 0917   CL 103 09/16/2023 0917   CO2 24 09/16/2023 0917   GLUCOSE 83 09/16/2023 0917   GLUCOSE 98 01/28/2018 1045   BUN 19 09/16/2023 0917  CREATININE 0.80 09/16/2023 0917   CALCIUM 9.5 09/16/2023 0917   PROT 6.6 09/16/2023 0917   ALBUMIN 4.2 09/16/2023 0917   AST 13 09/16/2023 0917   ALT 13 09/16/2023 0917   ALKPHOS 82 09/16/2023 0917   BILITOT 0.4 09/16/2023 0917   GFRNONAA >60 01/28/2018 1045   GFRAA >60 01/28/2018 1045   Lab Results   Component Value Date   CHOL 171 09/16/2023   HDL 45 09/16/2023   LDLCALC 108 (H) 09/16/2023   TRIG 95 09/16/2023   CHOLHDL 3.8 09/16/2023   Lab Results  Component Value Date   HGBA1C 5.6 09/16/2023   Lab Results  Component Value Date   VITAMINB12 273 06/13/2021   Lab Results  Component Value Date   TSH 2.760 09/16/2023     ASSESSMENT AND PLAN 53 y.o. year old female  has a past medical history of Complication of anesthesia, Constipation, Family history of breast cancer, Fatigue, and Sleep apnea. here with     ICD-10-CM   1. OSA on CPAP  G47.33 For home use only DME continuous positive airway pressure (CPAP)      Dezerae Freiberger Gatton is doing well on CPAP therapy. Compliance report reveals excellent daily and acceptable 4 hour usage. She was encouraged to continue using CPAP nightly and for greater than 4 hours each night. We will update supply orders as indicated. Risks of untreated sleep apnea review and education materials provided. Healthy lifestyle habits encouraged. She will follow up in 1 year, sooner if needed. She verbalizes understanding and agreement with this plan.    Orders Placed This Encounter  Procedures   For home use only DME continuous positive airway pressure (CPAP)    Heated Humidity with all supplies as needed    Length of Need:   Lifetime    Patient has OSA or probable OSA:   Yes    Is the patient currently using CPAP in the home:   Yes    Settings:   Other see comments    CPAP supplies needed:   Mask, headgear, cushions, filters, heated tubing and water chamber     No orders of the defined types were placed in this encounter.   I personally spent a total of 30 minutes in the care of the patient today including preparing to see the patient, getting/reviewing separately obtained history, performing a medically appropriate exam/evaluation, counseling and educating, placing orders, documenting clinical information in the EHR, independently interpreting results,  and communicating results.   Mahum Betten, FNP-C 08/09/2024, 11:17 AM [facility] [address], [address] [place], [place] [address] [phone]

## 2024-08-04 NOTE — Patient Instructions (Signed)

## 2024-08-05 NOTE — Progress Notes (Signed)
 [name] [name]

## 2024-08-09 ENCOUNTER — Ambulatory Visit: Payer: BC Managed Care – PPO | Admitting: Family Medicine

## 2024-08-09 ENCOUNTER — Encounter: Payer: Self-pay | Admitting: Family Medicine

## 2024-08-09 VITALS — BP 102/70 | HR 87 | Ht 66.0 in | Wt 238.5 lb

## 2024-08-09 DIAGNOSIS — G4733 Obstructive sleep apnea (adult) (pediatric): Secondary | ICD-10-CM
